# Patient Record
Sex: Female | Born: 1985 | Race: White | Hispanic: No | Marital: Single | Smoking: Never smoker
Health system: Southern US, Community
[De-identification: ages and names within clinical notes are randomized; demographics above are authoritative.]

## PROBLEM LIST (undated history)

## (undated) ENCOUNTER — Inpatient Hospital Stay (HOSPITAL_COMMUNITY): Payer: Self-pay

## (undated) DIAGNOSIS — E039 Hypothyroidism, unspecified: Secondary | ICD-10-CM

## (undated) DIAGNOSIS — R87629 Unspecified abnormal cytological findings in specimens from vagina: Secondary | ICD-10-CM

## (undated) DIAGNOSIS — D62 Acute posthemorrhagic anemia: Secondary | ICD-10-CM

## (undated) DIAGNOSIS — L739 Follicular disorder, unspecified: Secondary | ICD-10-CM

## (undated) HISTORY — PX: APPENDECTOMY: SHX54

## (undated) HISTORY — PX: LEEP: SHX91

---

## 2014-10-18 DIAGNOSIS — Z8742 Personal history of other diseases of the female genital tract: Secondary | ICD-10-CM | POA: Insufficient documentation

## 2015-04-26 ENCOUNTER — Encounter: Payer: Self-pay | Admitting: *Deleted

## 2015-04-26 ENCOUNTER — Emergency Department
Admission: EM | Admit: 2015-04-26 | Discharge: 2015-04-26 | Disposition: A | Discharge status: Home / Self Care | Payer: 59 | Source: Home / Self Care | Attending: Emergency Medicine | Admitting: Emergency Medicine

## 2015-04-26 DIAGNOSIS — S161XXA Strain of muscle, fascia and tendon at neck level, initial encounter: Secondary | ICD-10-CM | POA: Diagnosis not present

## 2015-04-26 DIAGNOSIS — M6248 Contracture of muscle, other site: Secondary | ICD-10-CM

## 2015-04-26 DIAGNOSIS — M62838 Other muscle spasm: Secondary | ICD-10-CM

## 2015-04-26 MED ORDER — CYCLOBENZAPRINE HCL 5 MG PO TABS: ORAL_TABLET | ORAL | Status: DC

## 2015-04-26 NOTE — ED Provider Notes (Signed)
CSN: [id number]     Arrival date & time 04/26/15  1025 History   First MD Initiated Contact with Patient 04/26/15 1046     Chief Complaint  Patient presents with  . Neck Pain    Patient is a 29 y.o. female presenting with musculoskeletal pain. The history is provided by the patient.  Muscle Pain This is a new (Right posterior neck pain, feels like the muscle spasm,) problem. Episode onset: About 9 days ago. The problem occurs constantly. The problem has not changed since onset.Pertinent negatives include no chest pain, no abdominal pain, no headaches and no shortness of breath. The symptoms are aggravated by bending and twisting. The symptoms are relieved by rest.   she recalls no injury. No radiation of neck pain. Denies focal weakness or numbness. The character of the pain is dull and crampy.  Denies back pain. No leg complaints or arm complaints History reviewed. No pertinent past medical history. Past Surgical History  Procedure Laterality Date  . Cesarean section     Family History  Problem Relation Age of Onset  . Hypertension Mother   . Hyperlipidemia Mother   . COPD Mother    History  Substance Use Topics  . Smoking status: Never Smoker   . Smokeless tobacco: Not on file  . Alcohol Use: No   OB History    No data available     Review of Systems  Respiratory: Negative for shortness of breath.   Cardiovascular: Negative for chest pain.  Gastrointestinal: Negative for abdominal pain.  Neurological: Negative for headaches.  Remainder of Review of Systems negative for acute change except as noted in the HPI.   Allergies  Review of patient's allergies indicates no known allergies.  Home Medications   Prior to Admission medications   Medication Sig Start Date End Date Taking? Authorizing Provider  MULTIPLE VITAMIN PO Take by mouth.   Yes Historical Provider, MD  cyclobenzaprine (FLEXERIL) 5 MG tablet Take 1 or 2 at bedtime as needed for muscle relaxant. Caution:  May cause drowsiness. 04/26/15   Lajean Manesavid Massey, MD   BP 110/74 mmHg  Pulse 76  Temp(Src) 98.4 F (36.9 C) (Oral)  Resp 16  Ht 5\' 1"  (1.549 m)  Wt 170 lb (77.111 kg)  BMI 32.14 kg/m2  SpO2 100% Physical Exam  Constitutional: She is oriented to person, place, and time. She appears well-developed and well-nourished. No distress.  HENT:  Head: Normocephalic and atraumatic.  Eyes: Conjunctivae and EOM are normal. Pupils are equal, round, and reactive to light. No scleral icterus.  Neck: Normal range of motion.  Cardiovascular: Normal rate.   Pulmonary/Chest: Effort normal.  Abdominal: She exhibits no distension.  Musculoskeletal: Normal range of motion.  Neurological: She is alert and oriented to person, place, and time.  Skin: Skin is warm.  Psychiatric: She has a normal mood and affect.  Nursing note and vitals reviewed.  right posterior cervical muscle spasm and tenderness . no spinal tenderness. No deformity or any neurologic deficit. There is some decreased range of motion of C-spine. Decreased range of motion to torsion and lateral bending to the left, and this motion exacerbates her pain.   ED Course  Procedures (including critical care time) Labs Review Labs Reviewed - No data to display  Imaging Review No results found.   MDM   1. Cervical muscle strain, initial encounter   2. Neck muscle spasm    right posterior cervical muscle spasm and tenderness . no spinal tenderness or  any neurologic deficit or symptoms. No evidence of discogenic cause. She has no point tenderness over C-spine, and after discussion, she declined any imaging at this time and I agree. Treatment options discussed, as well as risks, benefits, alternatives. Patient voiced understanding and agreement with the following plans: cyclobenzaprine (FLEXERIL) 5 MG tablet Take 1 or 2 at bedtime as needed for muscle relaxant. Caution: May cause drowsiness. 15 tablet   She declined any other prescription pain  medication. May use Tylenol or ibuprofen Other nonpharmacologic methods discussed. Gradual increased range of motion exercises. Heat or cold packs. Follow-up with your primary care doctor or orthopedist in 5-7 days if not improving, or sooner if symptoms become worse. Precautions discussed. Red flags discussed. Questions invited and answered. Patient voiced understanding and agreement.     Lajean Manes, MD 04/26/15 2013

## 2015-04-26 NOTE — ED Notes (Signed)
Pt c/o RT side neck/upper back pain x 1 wk. Denies injury.Tylenol helps some.

## 2016-01-30 ENCOUNTER — Emergency Department (HOSPITAL_COMMUNITY): Payer: 59 | Admitting: Anesthesiology

## 2016-01-30 ENCOUNTER — Encounter (HOSPITAL_BASED_OUTPATIENT_CLINIC_OR_DEPARTMENT_OTHER): Payer: Self-pay

## 2016-01-30 ENCOUNTER — Observation Stay (HOSPITAL_BASED_OUTPATIENT_CLINIC_OR_DEPARTMENT_OTHER)
Admission: EM | Admit: 2016-01-30 | Discharge: 2016-01-31 | Disposition: A | Discharge status: Home / Self Care | Payer: 59 | Attending: General Surgery | Admitting: General Surgery

## 2016-01-30 ENCOUNTER — Encounter (HOSPITAL_COMMUNITY)
Admission: EM | Disposition: A | Discharge status: Home / Self Care | Payer: Self-pay | Source: Home / Self Care | Attending: Emergency Medicine

## 2016-01-30 ENCOUNTER — Emergency Department (HOSPITAL_BASED_OUTPATIENT_CLINIC_OR_DEPARTMENT_OTHER): Payer: 59

## 2016-01-30 DIAGNOSIS — K353 Acute appendicitis with localized peritonitis: Principal | ICD-10-CM | POA: Insufficient documentation

## 2016-01-30 DIAGNOSIS — K3580 Unspecified acute appendicitis: Secondary | ICD-10-CM | POA: Diagnosis not present

## 2016-01-30 DIAGNOSIS — R1010 Upper abdominal pain, unspecified: Secondary | ICD-10-CM | POA: Diagnosis not present

## 2016-01-30 HISTORY — PX: LAPAROSCOPIC APPENDECTOMY: SHX408

## 2016-01-30 LAB — COMPREHENSIVE METABOLIC PANEL
ALK PHOS: 72 U/L (ref 38–126)
ALT: 18 U/L (ref 14–54)
AST: 18 U/L (ref 15–41)
Albumin: 4.4 g/dL (ref 3.5–5.0)
Anion gap: 9 (ref 5–15)
BUN: 13 mg/dL (ref 6–20)
CALCIUM: 9.1 mg/dL (ref 8.9–10.3)
CO2: 26 mmol/L (ref 22–32)
Chloride: 102 mmol/L (ref 101–111)
Creatinine, Ser: 1.06 mg/dL — ABNORMAL HIGH (ref 0.44–1.00)
GFR calc non Af Amer: >60 mL/min (ref >60)
Glucose, Bld: 112 mg/dL — ABNORMAL HIGH (ref 65–99)
Potassium: 4.2 mmol/L (ref 3.5–5.1)
Sodium: 137 mmol/L (ref 135–145)
Total Bilirubin: 1.5 mg/dL — ABNORMAL HIGH (ref 0.3–1.2)
Total Protein: 7.5 g/dL (ref 6.5–8.1)

## 2016-01-30 LAB — CBC WITH DIFFERENTIAL/PLATELET
Basophils Absolute: 0 10*3/uL (ref 0.0–0.1)
Basophils Relative: 0 %
EOS ABS: 0 10*3/uL (ref 0.0–0.7)
EOS PCT: 0 %
HCT: 39.8 % (ref 36.0–46.0)
Hemoglobin: 13.1 g/dL (ref 12.0–15.0)
LYMPHS ABS: 1.4 10*3/uL (ref 0.7–4.0)
Lymphocytes Relative: 7 %
MCH: 28.5 pg (ref 26.0–34.0)
MCHC: 32.9 g/dL (ref 30.0–36.0)
MCV: 86.7 fL (ref 78.0–100.0)
MONO ABS: 1 10*3/uL (ref 0.1–1.0)
Monocytes Relative: 5 %
Neutro Abs: 17.5 10*3/uL — ABNORMAL HIGH (ref 1.7–7.7)
Neutrophils Relative %: 88 %
PLATELETS: 253 10*3/uL (ref 150–400)
RBC: 4.59 MIL/uL (ref 3.87–5.11)
RDW: 13.1 % (ref 11.5–15.5)
WBC: 20 10*3/uL — AB (ref 4.0–10.5)

## 2016-01-30 LAB — URINALYSIS, ROUTINE W REFLEX MICROSCOPIC
BILIRUBIN URINE: NEGATIVE
Glucose, UA: NEGATIVE mg/dL
HGB URINE DIPSTICK: NEGATIVE
Ketones, ur: NEGATIVE mg/dL
Leukocytes, UA: NEGATIVE
Nitrite: NEGATIVE
Protein, ur: NEGATIVE mg/dL
SPECIFIC GRAVITY, URINE: 1.017 (ref 1.005–1.030)
pH: 7.5 (ref 5.0–8.0)

## 2016-01-30 LAB — LIPASE, BLOOD: Lipase: 25 U/L (ref 11–51)

## 2016-01-30 LAB — PREGNANCY, URINE: Preg Test, Ur: NEGATIVE

## 2016-01-30 SURGERY — APPENDECTOMY, LAPAROSCOPIC
Anesthesia: General | Site: Abdomen

## 2016-01-30 MED ORDER — ROCURONIUM BROMIDE 100 MG/10ML IV SOLN
INTRAVENOUS | Status: DC | PRN
  Administered 2016-01-30: 20 mg via INTRAVENOUS

## 2016-01-30 MED ORDER — GI COCKTAIL ~~LOC~~
30.0000 mL | Freq: Once | ORAL | Status: AC
  Administered 2016-01-30: 30 mL via ORAL
  Filled 2016-01-30: qty 30

## 2016-01-30 MED ORDER — SUCCINYLCHOLINE CHLORIDE 20 MG/ML IJ SOLN
INTRAMUSCULAR | Status: DC | PRN
  Administered 2016-01-30: 100 mg via INTRAVENOUS

## 2016-01-30 MED ORDER — 0.9 % SODIUM CHLORIDE (POUR BTL) OPTIME
TOPICAL | Status: DC | PRN
  Administered 2016-01-30: 1000 mL

## 2016-01-30 MED ORDER — IOHEXOL 300 MG/ML  SOLN
25.0000 mL | Freq: Once | INTRAMUSCULAR | Status: AC | PRN
  Administered 2016-01-30: 25 mL via ORAL

## 2016-01-30 MED ORDER — NEOSTIGMINE METHYLSULFATE 10 MG/10ML IV SOLN
INTRAVENOUS | Status: DC | PRN
  Administered 2016-01-30: 4 mg via INTRAVENOUS

## 2016-01-30 MED ORDER — LACTATED RINGERS IR SOLN
Status: DC | PRN
  Administered 2016-01-30: 1

## 2016-01-30 MED ORDER — AZITHROMYCIN 250 MG PO TABS
1000.0000 mg | ORAL_TABLET | Freq: Once | ORAL | Status: AC
  Administered 2016-01-30: 1000 mg via ORAL
  Filled 2016-01-30: qty 4

## 2016-01-30 MED ORDER — DEXTROSE 5 % IV SOLN: 1.0000 g | Freq: Once | INTRAVENOUS | Status: DC

## 2016-01-30 MED ORDER — HYDROMORPHONE HCL 1 MG/ML IJ SOLN
1.0000 mg | Freq: Once | INTRAMUSCULAR | Status: AC
  Administered 2016-01-30: 1 mg via INTRAVENOUS
  Filled 2016-01-30: qty 1

## 2016-01-30 MED ORDER — LACTATED RINGERS IV SOLN
INTRAVENOUS | Status: DC | PRN
  Administered 2016-01-30: 22:00:00 via INTRAVENOUS

## 2016-01-30 MED ORDER — BUPIVACAINE HCL (PF) 0.5 % IJ SOLN
INTRAMUSCULAR | Status: AC
  Filled 2016-01-30: qty 30

## 2016-01-30 MED ORDER — METRONIDAZOLE IN NACL 5-0.79 MG/ML-% IV SOLN
500.0000 mg | Freq: Once | INTRAVENOUS | Status: AC
  Administered 2016-01-30: 500 mg via INTRAVENOUS
  Filled 2016-01-30: qty 100

## 2016-01-30 MED ORDER — CEFTRIAXONE SODIUM 1 G IJ SOLR
1.0000 g | Freq: Once | INTRAMUSCULAR | Status: AC
  Administered 2016-01-30: 1 g via INTRAVENOUS
  Filled 2016-01-30: qty 10

## 2016-01-30 MED ORDER — DEXAMETHASONE SODIUM PHOSPHATE 10 MG/ML IJ SOLN
INTRAMUSCULAR | Status: DC | PRN
  Administered 2016-01-30: 10 mg via INTRAVENOUS

## 2016-01-30 MED ORDER — FENTANYL CITRATE (PF) 250 MCG/5ML IJ SOLN
INTRAMUSCULAR | Status: DC | PRN
  Administered 2016-01-30 (×5): 50 ug via INTRAVENOUS

## 2016-01-30 MED ORDER — HYDROMORPHONE HCL 1 MG/ML IJ SOLN
INTRAMUSCULAR | Status: AC
  Administered 2016-01-30: 0.5 mg via INTRAVENOUS
  Filled 2016-01-30: qty 1

## 2016-01-30 MED ORDER — LIDOCAINE HCL (CARDIAC) 20 MG/ML IV SOLN
INTRAVENOUS | Status: DC | PRN
  Administered 2016-01-30: 100 mg via INTRATRACHEAL

## 2016-01-30 MED ORDER — GLYCOPYRROLATE 0.2 MG/ML IJ SOLN
INTRAMUSCULAR | Status: DC | PRN
  Administered 2016-01-30: 0.6 mg via INTRAVENOUS

## 2016-01-30 MED ORDER — ONDANSETRON HCL 4 MG/2ML IJ SOLN
4.0000 mg | Freq: Once | INTRAMUSCULAR | Status: AC
  Administered 2016-01-30: 4 mg via INTRAVENOUS
  Filled 2016-01-30: qty 2

## 2016-01-30 MED ORDER — ONDANSETRON HCL 4 MG/2ML IJ SOLN: 4.0000 mg | Freq: Once | INTRAMUSCULAR | Status: DC | PRN

## 2016-01-30 MED ORDER — HYDROMORPHONE HCL 1 MG/ML IJ SOLN
0.2500 mg | INTRAMUSCULAR | Status: DC | PRN
  Administered 2016-01-30 (×2): 0.5 mg via INTRAVENOUS

## 2016-01-30 MED ORDER — OXYCODONE HCL 5 MG PO TABS: 5.0000 mg | ORAL_TABLET | Freq: Once | ORAL | Status: DC | PRN

## 2016-01-30 MED ORDER — OXYCODONE HCL 5 MG/5ML PO SOLN
5.0000 mg | Freq: Once | ORAL | Status: DC | PRN
  Filled 2016-01-30: qty 5

## 2016-01-30 MED ORDER — ONDANSETRON HCL 4 MG/2ML IJ SOLN
INTRAMUSCULAR | Status: DC | PRN
  Administered 2016-01-30: 4 mg via INTRAVENOUS

## 2016-01-30 MED ORDER — PROPOFOL 10 MG/ML IV BOLUS
INTRAVENOUS | Status: DC | PRN
  Administered 2016-01-30: 150 mg via INTRAVENOUS

## 2016-01-30 MED ORDER — BUPIVACAINE HCL (PF) 0.5 % IJ SOLN
INTRAMUSCULAR | Status: DC | PRN
  Administered 2016-01-30: 14 mL

## 2016-01-30 MED ORDER — MIDAZOLAM HCL 5 MG/5ML IJ SOLN
INTRAMUSCULAR | Status: DC | PRN
  Administered 2016-01-30: 2 mg via INTRAVENOUS

## 2016-01-30 MED ORDER — IOHEXOL 300 MG/ML  SOLN
100.0000 mL | Freq: Once | INTRAMUSCULAR | Status: AC | PRN
  Administered 2016-01-30: 100 mL via INTRAVENOUS

## 2016-01-30 SURGICAL SUPPLY — 44 items
APPLIER CLIP 5 13 M/L LIGAMAX5 (MISCELLANEOUS)
APPLIER CLIP ROT 10 11.4 M/L (STAPLE)
BENZOIN TINCTURE PRP APPL 2/3 (GAUZE/BANDAGES/DRESSINGS) ×3 IMPLANT
CHLORAPREP W/TINT 26ML (MISCELLANEOUS) ×3 IMPLANT
CLIP APPLIE 5 13 M/L LIGAMAX5 (MISCELLANEOUS) IMPLANT
CLIP APPLIE ROT 10 11.4 M/L (STAPLE) IMPLANT
CLOSURE STERI-STRIP 1/4X4 (GAUZE/BANDAGES/DRESSINGS) ×3 IMPLANT
CLOSURE WOUND 1/2 X4 (GAUZE/BANDAGES/DRESSINGS) ×1
COVER SURGICAL LIGHT HANDLE (MISCELLANEOUS) ×3 IMPLANT
CUTTER FLEX LINEAR 45M (STAPLE) ×3 IMPLANT
DECANTER SPIKE VIAL GLASS SM (MISCELLANEOUS) ×3 IMPLANT
DRAIN CHANNEL 19F RND (DRAIN) IMPLANT
DRAPE LAPAROSCOPIC ABDOMINAL (DRAPES) ×3 IMPLANT
DRSG TEGADERM 2-3/8X2-3/4 SM (GAUZE/BANDAGES/DRESSINGS) ×3 IMPLANT
ELECT REM PT RETURN 9FT ADLT (ELECTROSURGICAL) ×3
ELECTRODE REM PT RTRN 9FT ADLT (ELECTROSURGICAL) ×1 IMPLANT
ENDOLOOP SUT PDS II  0 18 (SUTURE)
ENDOLOOP SUT PDS II 0 18 (SUTURE) IMPLANT
EVACUATOR SILICONE 100CC (DRAIN) IMPLANT
GAUZE SPONGE 2X2 8PLY STRL LF (GAUZE/BANDAGES/DRESSINGS) ×1 IMPLANT
GLOVE ECLIPSE 8.0 STRL XLNG CF (GLOVE) ×3 IMPLANT
GLOVE INDICATOR 8.0 STRL GRN (GLOVE) ×3 IMPLANT
GOWN STRL REUS W/TWL XL LVL3 (GOWN DISPOSABLE) ×6 IMPLANT
KIT BASIN OR (CUSTOM PROCEDURE TRAY) ×3 IMPLANT
POUCH SPECIMEN RETRIEVAL 10MM (ENDOMECHANICALS) ×3 IMPLANT
RELOAD 45 VASCULAR/THIN (ENDOMECHANICALS) IMPLANT
RELOAD STAPLE TA45 3.5 REG BLU (ENDOMECHANICALS) ×3 IMPLANT
SCISSORS LAP 5X35 DISP (ENDOMECHANICALS) ×3 IMPLANT
SET IRRIG TUBING LAPAROSCOPIC (IRRIGATION / IRRIGATOR) ×3 IMPLANT
SHEARS HARMONIC ACE PLUS 36CM (ENDOMECHANICALS) ×3 IMPLANT
SLEEVE XCEL OPT CAN 5 100 (ENDOMECHANICALS) ×3 IMPLANT
SOLUTION ANTI FOG 6CC (MISCELLANEOUS) ×3 IMPLANT
SPONGE GAUZE 2X2 STER 10/PKG (GAUZE/BANDAGES/DRESSINGS) ×2
STRIP CLOSURE SKIN 1/2X4 (GAUZE/BANDAGES/DRESSINGS) ×2 IMPLANT
SUT ETHILON 3 0 PS 1 (SUTURE) IMPLANT
SUT MNCRL AB 4-0 PS2 18 (SUTURE) ×3 IMPLANT
TOWEL OR 17X26 10 PK STRL BLUE (TOWEL DISPOSABLE) ×3 IMPLANT
TOWEL OR NON WOVEN STRL DISP B (DISPOSABLE) ×3 IMPLANT
TRAY FOLEY W/METER SILVER 14FR (SET/KITS/TRAYS/PACK) ×3 IMPLANT
TRAY FOLEY W/METER SILVER 16FR (SET/KITS/TRAYS/PACK) ×3 IMPLANT
TRAY LAPAROSCOPIC (CUSTOM PROCEDURE TRAY) ×3 IMPLANT
TROCAR BLADELESS OPT 5 100 (ENDOMECHANICALS) ×3 IMPLANT
TROCAR XCEL BLUNT TIP 100MML (ENDOMECHANICALS) ×3 IMPLANT
TUBING INSUFFLATION 10FT LAP (TUBING) ×3 IMPLANT

## 2016-01-30 NOTE — ED Notes (Signed)
Pa  at bedside. 

## 2016-01-30 NOTE — ED Notes (Signed)
Consent obtained

## 2016-01-30 NOTE — ED Notes (Signed)
amb to BR w/o difficulty 

## 2016-01-30 NOTE — ED Notes (Signed)
Patient transported to CT 

## 2016-01-30 NOTE — H&P (Signed)
Hannah Floyd is an 30 y.o. female.   Chief Complaint:   Abdominal pain HPI:   She had the onset of upper abdominal pain today that radiated to her lower abdomen and intensified. She had some nausea but no fever or chills. She went to [facility] for evaluation.  She was noted to have a leukocytosis while there. CT scan demonstrated a dilated appendix and some early inflammatory changes consistent with acute appendicitis by CT criteria.Urinalysis was negative. She subsequently was sent here for definitive treatment for her acute appendicitis. She works as a Marine scientist at [employer].  History reviewed. No pertinent past medical history.  Past Surgical History  Procedure Laterality Date  . Cesarean section      Family History  Problem Relation Age of Onset  . Hypertension Mother   . Hyperlipidemia Mother   . COPD Mother    Social History:  reports that she has never smoked. She does not have any smokeless tobacco history on file. She reports that she does not drink alcohol or use illicit drugs.  Allergies: No Known Allergies   (Not in a hospital admission)  Results for orders placed or performed during the hospital encounter of 01/30/16 (from the past 48 hour(s))  Urinalysis, Routine w reflex microscopic (not at [facility])     Status: None   Collection Time: 01/30/16  3:30 PM  Result Value Ref Range   Color, Urine YELLOW YELLOW   APPearance CLEAR CLEAR   Specific Gravity, Urine 1.017 1.005 - 1.030   pH 7.5 5.0 - 8.0   Glucose, UA NEGATIVE NEGATIVE mg/dL   Hgb urine dipstick NEGATIVE NEGATIVE   Bilirubin Urine NEGATIVE NEGATIVE   Ketones, ur NEGATIVE NEGATIVE mg/dL   Protein, ur NEGATIVE NEGATIVE mg/dL   Nitrite NEGATIVE NEGATIVE   Leukocytes, UA NEGATIVE NEGATIVE    Comment: MICROSCOPIC NOT DONE ON URINES WITH NEGATIVE PROTEIN, BLOOD, LEUKOCYTES, NITRITE, OR GLUCOSE <1000 mg/dL.  Pregnancy, urine     Status: None   Collection Time: 01/30/16  3:30 PM  Result Value  Ref Range   Preg Test, Ur NEGATIVE NEGATIVE    Comment:        THE SENSITIVITY OF THIS METHODOLOGY IS >20 mIU/mL.   Comprehensive metabolic panel     Status: Abnormal   Collection Time: 01/30/16  5:35 PM  Result Value Ref Range   Sodium 137 135 - 145 mmol/L   Potassium 4.2 3.5 - 5.1 mmol/L   Chloride 102 101 - 111 mmol/L   CO2 26 22 - 32 mmol/L   Glucose, Bld 112 (H) 65 - 99 mg/dL   BUN 13 6 - 20 mg/dL   Creatinine, Ser 1.06 (H) 0.44 - 1.00 mg/dL   Calcium 9.1 8.9 - 10.3 mg/dL   Total Protein 7.5 6.5 - 8.1 g/dL   Albumin 4.4 3.5 - 5.0 g/dL   AST 18 15 - 41 U/L   ALT 18 14 - 54 U/L   Alkaline Phosphatase 72 38 - 126 U/L   Total Bilirubin 1.5 (H) 0.3 - 1.2 mg/dL   GFR calc non Af Amer >60 >60 mL/min   GFR calc Af Amer >60 >60 mL/min    Comment: (NOTE) The eGFR has been calculated using the CKD EPI equation. This calculation has not been validated in all clinical situations. eGFR's persistently <60 mL/min signify possible Chronic Kidney Disease.    Anion gap 9 5 - 15  Lipase, blood     Status: None  Collection Time: 01/30/16  5:35 PM  Result Value Ref Range   Lipase 25 11 - 51 U/L  CBC with Differential     Status: Abnormal   Collection Time: 01/30/16  5:35 PM  Result Value Ref Range   WBC 20.0 (H) 4.0 - 10.5 K/uL   RBC 4.59 3.87 - 5.11 MIL/uL   Hemoglobin 13.1 12.0 - 15.0 g/dL   HCT 39.8 36.0 - 46.0 %   MCV 86.7 78.0 - 100.0 fL   MCH 28.5 26.0 - 34.0 pg   MCHC 32.9 30.0 - 36.0 g/dL   RDW 13.1 11.5 - 15.5 %   Platelets 253 150 - 400 K/uL   Neutrophils Relative % 88 %   Neutro Abs 17.5 (H) 1.7 - 7.7 K/uL   Lymphocytes Relative 7 %   Lymphs Abs 1.4 0.7 - 4.0 K/uL   Monocytes Relative 5 %   Monocytes Absolute 1.0 0.1 - 1.0 K/uL   Eosinophils Relative 0 %   Eosinophils Absolute 0.0 0.0 - 0.7 K/uL   Basophils Relative 0 %   Basophils Absolute 0.0 0.0 - 0.1 K/uL   Ct Abdomen Pelvis W Contrast  01/30/2016  CLINICAL DATA:  Complains of upper abdominal pain today,  nausea. EXAM: CT ABDOMEN AND PELVIS WITH CONTRAST TECHNIQUE: Multidetector CT imaging of the abdomen and pelvis was performed using the standard protocol following bolus administration of intravenous contrast. CONTRAST:  73m OMNIPAQUE IOHEXOL 300 MG/ML SOLN, 1040mOMNIPAQUE IOHEXOL 300 MG/ML SOLN COMPARISON:  None. FINDINGS: BODY WALL: Unremarkable LOWER CHEST: Unremarkable. ABDOMEN/PELVIS: Liver: No focal abnormality. Biliary: No evidence of biliary obstruction or stone. Pancreas: Unremarkable. Spleen: Unremarkable. Adrenals: Unremarkable. Kidneys and ureters: No hydronephrosis or stone. Bladder: Unremarkable. Reproductive: Unremarkable. RIGHT corpus luteum cyst. IUD appropriately positioned. Bowel: No obstruction. Enlarged fluid-filled appendix, up to 8 mm diameter, mild periappendiceal stranding. Retroperitoneum: No mass or adenopathy. Peritoneum: No free air. Small amount of free fluid considered to be physiologic from corpus luteum cyst. Vascular: No acute abnormality. OSSEOUS: No acute abnormalities. IMPRESSION: Early acute appendicitis.  No perforation. RIGHT corpus luteum cyst.  Small amount of free screw cul-de-sac. These results were called by telephone at the time of interpretation on 01/30/2016 at 7:18 pm to Dr. BEAudie Pintowho verbally acknowledged these results. Electronically Signed   By: JoStaci Righter.D.   On: 01/30/2016 19:22    Review of Systems  Constitutional: Negative for fever and chills.  Respiratory: Negative for shortness of breath.   Cardiovascular: Negative for chest pain.  Gastrointestinal: Positive for nausea and abdominal pain. Negative for diarrhea and constipation.  Genitourinary: Negative for dysuria and hematuria.  Neurological: Negative for focal weakness and seizures.  Endo/Heme/Allergies:       No bleeding disorders.    Blood pressure 123/86, pulse 113, temperature 98 F (36.7 C), temperature source Oral, resp. rate 18, height 5' 1"  (1.549 m), weight 77.111 kg (170  lb), SpO2 98 %. Physical Exam  Constitutional: She appears well-developed and well-nourished.  She is slightly anxious.  HENT:  Head: Normocephalic and atraumatic.  Eyes: No scleral icterus.  Cardiovascular:  Increased rate.  Respiratory: Effort normal and breath sounds normal.  GI: Soft. She exhibits no mass. There is tenderness (Right lower quadrantand suprapubic regions.). There is no guarding.  Musculoskeletal: She exhibits no edema.  Neurological: She is alert.  Skin: Skin is warm and dry.  Psychiatric: She has a normal mood and affect. Her behavior is normal.     Assessment/Plan Acute appendicitis.  Plan: Intravenous  antibiotics. Laparoscopic possible open appendectomy. I have discussed the procedure, aftercare, and risks of appendectomy. The risks include but are not limited to bleeding, infection, wound problems, anesthesia, injury to intra-abdominal organs, possibility of postoperative ileus. She seems to understand and agrees with the plan.  Odis Hollingshead, MD 01/30/2016, 9:44 PM

## 2016-01-30 NOTE — Transfer of Care (Signed)
Immediate Anesthesia Transfer of Care Note  Patient: Hannah Floyd  Procedure(s) Performed: Procedure(s): APPENDECTOMY LAPAROSCOPIC (N/A)  Patient Location: PACU  Anesthesia Type:General  Level of Consciousness:  sedated, patient cooperative and responds to stimulation  Airway & Oxygen Therapy:Patient Spontanous Breathing and Patient connected to face mask oxgen  Post-op Assessment:  Report given to PACU RN and Post -op Vital signs reviewed and stable  Post vital signs:  Reviewed and stable  Last Vitals:  Filed Vitals:   01/30/16 1749 01/30/16 2014  BP: 113/72 123/86  Pulse: 98 113  Temp:    Resp: 18     Complications: No apparent anesthesia complications

## 2016-01-30 NOTE — Discharge Instructions (Addendum)
LAPAROSCOPIC SURGERY: POST OP INSTRUCTIONS ° °1. DIET: Follow a light bland diet the first 24 hours after arrival home, such as soup, liquids, crackers, etc.  Be sure to include lots of fluids daily.  Avoid fast food or heavy meals as your are more likely to get nauseated.  Eat a low fat the next few days after surgery.   °2. Take your usually prescribed home medications unless otherwise directed. °3. PAIN CONTROL: °a. Pain is best controlled by a usual combination of three different methods TOGETHER: °i. Ice/Heat °ii. Over the counter pain medication °iii. Prescription pain medication °b. Most patients will experience some swelling and bruising around the incisions.  Ice packs or heating pads (30-60 minutes up to 6 times a day) will help. Use ice for the first few days to help decrease swelling and bruising, then switch to heat to help relax tight/sore spots and speed recovery.  Some people prefer to use ice alone, heat alone, alternating between ice & heat.  Experiment to what works for you.  Swelling and bruising can take several weeks to resolve.   °c. It is helpful to take an over-the-counter pain medication regularly for the first few weeks.  Choose one of the following that works best for you: °i. Naproxen (Aleve, etc)  Two 220mg tabs twice a day °ii. Ibuprofen (Advil, etc) Three 200mg tabs four times a day (every meal & bedtime) °iii. Acetaminophen (Tylenol, etc) 500-650mg four times a day (every meal & bedtime) °d. A  prescription for pain medication (such as oxycodone, hydrocodone, etc) should be given to you upon discharge.  Take your pain medication as prescribed.  °i. If you are having problems/concerns with the prescription medicine (does not control pain, nausea, vomiting, rash, itching, etc), please call us [phone] to see if we need to switch you to a different pain medicine that will work better for you and/or control your side effect better. °ii. If you need a refill on your pain medication,  please contact your pharmacy.  They will contact our office to request authorization. Prescriptions will not be filled after 5 pm or on week-ends. °4. Avoid getting constipated.  Between the surgery and the pain medications, it is common to experience some constipation.  Increasing fluid intake and taking a fiber supplement (such as Metamucil, Citrucel, FiberCon, MiraLax, etc) 1-2 times a day regularly will usually help prevent this problem from occurring.  A mild laxative (prune juice, Milk of Magnesia, MiraLax, etc) should be taken according to package directions if there are no bowel movements after 48 hours.   °5. Watch out for diarrhea.  If you have many loose bowel movements, simplify your diet to bland foods & liquids for a few days.  Stop any stool softeners and decrease your fiber supplement.  Switching to mild anti-diarrheal medications (Kayopectate, Pepto Bismol) can help.  If this worsens or does not improve, please call us. °6. Wash / shower every day.  You may shower over the dressings as they are waterproof.  Continue to shower over incision(s) after the dressing is off. °7. Remove your waterproof bandages 3 days after surgery.  You may leave the incision open to air.  You may replace a dressing/Band-Aid to cover the incision for comfort if you wish.  °8. ACTIVITIES as tolerated:   °a. You may resume regular (light) daily activities beginning the next day--such as daily self-care, walking, climbing stairs--gradually increasing light activities as tolerated.  No heavy lifting (over 10 pounds), straining, or   intense activities for 2 weeks. °b. DO NOT PUSH THROUGH PAIN.  Let pain be your guide: If it hurts to do something, don't do it.  Pain is your body warning you to avoid that activity for another week until the pain goes down. °c. You may drive when you are no longer taking prescription pain medication, you can comfortably wear a seatbelt, and you can safely maneuver your car and apply  brakes. °d. You may have sexual intercourse when it is comfortable.  °9. FOLLOW UP in our office °a. Please call [facility] at [phone] to set up an appointment to see your surgeon in the office for a follow-up appointment approximately 2-3 weeks after your surgery. °b. Make sure that you call for this appointment the day you arrive home to insure a convenient appointment time. °10. IF YOU HAVE DISABILITY OR FAMILY LEAVE FORMS, BRING THEM TO THE OFFICE FOR PROCESSING.  DO NOT GIVE THEM TO YOUR DOCTOR. ° °11.  Return to work/school:  Desk work/light activities in 5-7 days, full duty/activities in 2 weeks if pain-free. ° ° °WHEN TO CALL US [phone]: °1. Poor pain control °2. Reactions / problems with new medications (rash/itching, nausea, etc)  °3. Fever over 101.5 F (38.5 C) °4. Inability to urinate °5. Nausea and/or vomiting °6. Worsening swelling or bruising °7. Continued bleeding from incision. °8. Increased pain, redness, or drainage from the incision ° ° The clinic staff is available to answer your questions during regular business hours (8:30am-5pm).  Please don’t hesitate to call and ask to speak to one of our nurses for clinical concerns.  ° If you have a medical emergency, go to the nearest emergency room or call 911. ° A surgeon from [facility] is always on call at the hospitals ° ° °[facility] °[address], [address], [place], [place]  [address] ? °MAIN: [phone] ? TOLL FREE: [phone] ?  °FAX [phone] °[email or web address] ° °

## 2016-01-30 NOTE — ED Notes (Signed)
abd pain, nausea, denies vaginal d/c or urinary s/s

## 2016-01-30 NOTE — ED Provider Notes (Signed)
CSN: [id number]     Arrival date & time 01/30/16  1512 History   First MD Initiated Contact with Patient 01/30/16 1625     Chief Complaint  Patient presents with  . Abdominal Pain     (Consider location/radiation/quality/duration/timing/severity/associated sxs/prior Treatment) HPI Hannah Floyd is a 30 y.o. female because of her evaluation of abdominal discomfort. Patient reports feeling very full last night. She reports onset of epigastric discomfort today it has been constant throughout the day. She reports this discomfort is worse after eating. He has not tried anything to improve symptoms. She reports some mild nausea. No radiation of pain. Reports normal bowel movements and is still passing gas. No fevers, chills, vomiting, urinary symptoms, vaginal bleeding or discharge. She reports last menstrual period was last month, but she uses Mirena and has irregular periods. No other modifying factors. No history of hypertension, hyperlipidemia, diabetes. No family cardiac history  History reviewed. No pertinent past medical history. Past Surgical History  Procedure Laterality Date  . Cesarean section     Family History  Problem Relation Age of Onset  . Hypertension Mother   . Hyperlipidemia Mother   . COPD Mother    Social History  Substance Use Topics  . Smoking status: Never Smoker   . Smokeless tobacco: None  . Alcohol Use: No   OB History    No data available     Review of Systems A 10 point review of systems was completed and was negative except for pertinent positives and negatives as mentioned in the history of present illness     Allergies  Review of patient's allergies indicates no known allergies.  Home Medications   Prior to Admission medications   Not on File   BP 113/72 mmHg  Pulse 98  Temp(Src) 98 F (36.7 C) (Oral)  Resp 18  Ht  (1.549 m)  Wt 77.111 kg  BMI 32.14 kg/m2  SpO2 99% Physical Exam  Constitutional: She is oriented to person,  place, and time. She appears well-developed and well-nourished.  HENT:  Head: Normocephalic and atraumatic.  Mouth/Throat: Oropharynx is clear and moist.  Eyes: Conjunctivae are normal. Pupils are equal, round, and reactive to light. Right eye exhibits no discharge. Left eye exhibits no discharge. No scleral icterus.  Neck: Neck supple.  Cardiovascular: Normal rate, regular rhythm and normal heart sounds.   Pulmonary/Chest: Effort normal and breath sounds normal. No respiratory distress. She has no wheezes. She has no rales.  Abdominal: Soft. There is no tenderness.  Tenderness diffusely throughout epigastrium and periumbilical region. Abdomen is otherwise soft, nondistended. No rebound or guarding. No other abnormalities.  Musculoskeletal: She exhibits no tenderness.  Neurological: She is alert and oriented to person, place, and time.  Cranial Nerves II-XII grossly intact  Skin: Skin is warm and dry. No rash noted.  Psychiatric: She has a normal mood and affect.  Nursing note and vitals reviewed.   ED Course  Procedures (including critical care time) Labs Review Labs Reviewed  COMPREHENSIVE METABOLIC PANEL - Abnormal; Notable for the following:    Glucose, Bld 112 (*)    Creatinine, Ser 1.06 (*)    Total Bilirubin 1.5 (*)    All other components within normal limits  CBC WITH DIFFERENTIAL/PLATELET - Abnormal; Notable for the following:    WBC 20.0 (*)    Neutro Abs 17.5 (*)    All other components within normal limits  URINE CULTURE  URINALYSIS, ROUTINE W REFLEX MICROSCOPIC (NOT AT [facility])  PREGNANCY, URINE  LIPASE, BLOOD    Imaging Review Ct Abdomen Pelvis W Contrast  01/30/2016  CLINICAL DATA:  Complains of upper abdominal pain today, nausea. EXAM: CT ABDOMEN AND PELVIS WITH CONTRAST TECHNIQUE: Multidetector CT imaging of the abdomen and pelvis was performed using the standard protocol following bolus administration of intravenous contrast. CONTRAST:  25mL OMNIPAQUE IOHEXOL 300  MG/ML SOLN, OMNIPAQUE IOHEXOL 300 MG/ML SOLN COMPARISON:  None. FINDINGS: BODY WALL: Unremarkable LOWER CHEST: Unremarkable. ABDOMEN/PELVIS: Liver: No focal abnormality. Biliary: No evidence of biliary obstruction or stone. Pancreas: Unremarkable. Spleen: Unremarkable. Adrenals: Unremarkable. Kidneys and ureters: No hydronephrosis or stone. Bladder: Unremarkable. Reproductive: Unremarkable. RIGHT corpus luteum cyst. IUD appropriately positioned. Bowel: No obstruction. Enlarged fluid-filled appendix, up to 8 mm diameter, mild periappendiceal stranding. Retroperitoneum: No mass or adenopathy. Peritoneum: No free air. Small amount of free fluid considered to be physiologic from corpus luteum cyst. Vascular: No acute abnormality. OSSEOUS: No acute abnormalities. IMPRESSION: Early acute appendicitis.  No perforation. RIGHT corpus luteum cyst.  Small amount of free screw cul-de-sac. These results were called by telephone at the time of interpretation on 01/30/2016 at 7:18 pm to Dr. Radford Pax, who verbally acknowledged these results. Electronically Signed   By: Elsie Stain M.D.   On: 01/30/2016 19:22   I have personally reviewed and evaluated these images and lab results as part of my medical decision-making.   EKG Interpretation None     Filed Vitals:   01/30/16 1530 01/30/16 1749  BP: 121/90 113/72  Pulse: 102 98  Temp: 98 F (36.7 C)   TempSrc: Oral   Resp: 18 18  Height:  (1.549 m)   Weight: 77.111 kg   SpO2: 100% 99%    MDM  Hannah Floyd is a 30 y.o. female who is otherwise healthy, comes in for evaluation of abdominal pain. She is hemodynamically stable and afebrile. She is diffusely tender in her epigastric and periumbilical region. Last ate at 12:00 PM. Basic labs show a leukocytosis of 20. Obtain CT abdomen to rule out appendicitis.. CT abdomen shows evidence of developing appendicitis. Discussed with general surgery, Dr. Abbey Chatters, recommends 1 g Rocephin, 500 mg metronidazole  and transfer to [facility] for subsequent surgery. Patient reports she feels well, declines any further pain medicine. She is stable for discharge at this time. Final diagnoses:  Acute appendicitis, unspecified acute appendicitis type        Joycie Peek, PA-C 01/30/16 2034  Nelva Nay, MD 01/30/16 2308

## 2016-01-30 NOTE — Op Note (Signed)
Appendectomy, Lap, Procedure Note  Pre-operative Diagnosis:  Acute appendicitis  Post-operative Diagnosis: Same  Procedure:  Laparoscopic appendectomy  Surgeon:  Avel Peace, M.D.  Anesthesia:  General   Indications:  This is a 30 year old female who presented to the ED with worsening centralized abdominal pain and a leukocytosis.  CT scan was consistent with acute appendicitis.  She is now brought to the OR for appendectomy.   Procedure Details   She was brought to the operating room, placed in the supine position and general anesthesia was induced, along with placement of orogastric tube, SCDs, and a Foley catheter. A timeout was performed. The abdomen was prepped and draped in a sterile fashion. A small infraumbilical incision was made through the skin, subcutaneous tissue, fascia, and peritoneum entering the peritoneal cavity under direct vision.  A pursestring suture was passed around the fascia with a 0 Vicryl.  The Hasson was introduced into the peritoneal cavity and the tails of the suture were used to hold the Hasson in place.   The pneumoperitoneum was then established to steady pressure of 15 mmHg.   The laparoscope was introduced and there was no evidence of bleeding or underlying organ injury. Additional 5 mm cannulas then placed in the left lower quadrant of the abdomen and the right upper quadrant region under direct visualization. A careful evaluation of the entire abdomen was carried out. The patient was placed in Trendelenburg and left lateral decubitus position. The small intestines were retracted in the cephalad and left lateral direction away from the pelvis and right lower quadrant. The patient was found to have an enlarged and inflamed appendix that was extending into the pelvis. There was no evidence of perforation.  The appendix was carefully mobilized. The mesoappendix was was divided with the harmonic scalpel.   The appendix was amputated off the cecum, with a  small cuff of cecum, using an endo-GIA stapler.  The appendix was placed in a retrieval bag and removed through the subumbilical port incision.    There was no evidence of bleeding, leakage, or complication after division of the appendix. Copious irrigation was  performed and irrigant fluid suctioned from the abdomen as much as possible.  The umbilical trocar was removed and the  port site fascia was closed via the purse string suture under laparoscopic vision. There was no residual palpable fascial defect.  The remaining trocars were removed and all  trocar site skin wounds were closed with 4-0 Monocryl.  Steri strips and sterile dressings were applied.  Instrument, sponge, and needle counts were correct at the conclusion of the case.   Findings: The appendix was found to be inflamed. There were not signs of necrosis.  There was not perforation. There was not abscess formation.  Estimated Blood Loss:  100 ml         Drains: none          Specimens: appendix         Complications:  None; patient tolerated the procedure well.         Disposition: PACU - hemodynamically stable.         Condition: stable

## 2016-01-30 NOTE — Anesthesia Procedure Notes (Signed)
Procedure Name: Intubation Date/Time: 01/30/2016 10:02 PM Performed by: Delphia Grates Pre-anesthesia Checklist: Emergency Drugs available, Patient identified, Suction available and Patient being monitored Patient Re-evaluated:Patient Re-evaluated prior to inductionOxygen Delivery Method: Circle system utilized Preoxygenation: Pre-oxygenation with 100% oxygen Intubation Type: IV induction, Rapid sequence and Cricoid Pressure applied Laryngoscope Size: Mac and 4 Grade View: Grade I Tube type: Oral Tube size: 7.5 mm Number of attempts: 1 Airway Equipment and Method: Stylet Placement Confirmation: ETT inserted through vocal cords under direct vision,  breath sounds checked- equal and bilateral and positive ETCO2 Secured at: 21 cm Tube secured with: Tape Dental Injury: Teeth and Oropharynx as per pre-operative assessment

## 2016-01-30 NOTE — ED Notes (Signed)
[facility] here for transport to [facility] ED

## 2016-01-30 NOTE — ED Notes (Signed)
MD at bedside. 

## 2016-01-30 NOTE — Anesthesia Preprocedure Evaluation (Signed)
Anesthesia Evaluation  Patient identified by MRN, date of birth, ID band Patient awake    Reviewed: Allergy & Precautions, NPO status , Patient's Chart, lab work & pertinent test results  Airway Mallampati: II  TM Distance: >3 FB Neck ROM: Full    Dental  (+) Teeth Intact, Dental Advisory Given   Pulmonary    breath sounds clear to auscultation       Cardiovascular  Rhythm:Regular Rate:Normal     Neuro/Psych    GI/Hepatic   Endo/Other    Renal/GU      Musculoskeletal   Abdominal   Peds  Hematology   Anesthesia Other Findings   Reproductive/Obstetrics                             Anesthesia Physical Anesthesia Plan  ASA: II and emergent  Anesthesia Plan: General   Post-op Pain Management:    Induction: Intravenous, Cricoid pressure planned and Rapid sequence  Airway Management Planned: Oral ETT  Additional Equipment:   Intra-op Plan:   Post-operative Plan: Extubation in OR  Informed Consent: I have reviewed the patients History and Physical, chart, labs and discussed the procedure including the risks, benefits and alternatives for the proposed anesthesia with the patient or authorized representative who has indicated his/her understanding and acceptance.   Dental advisory given  Plan Discussed with: CRNA and Anesthesiologist  Anesthesia Plan Comments:         Anesthesia Quick Evaluation

## 2016-01-30 NOTE — ED Notes (Signed)
Bed: WA17 Expected date:  Expected time:  Means of arrival:  Comments: [facility] from [facility] [place] [facility]/appy WBC 21

## 2016-01-31 ENCOUNTER — Encounter (HOSPITAL_COMMUNITY): Payer: Self-pay | Admitting: General Surgery

## 2016-01-31 ENCOUNTER — Encounter: Payer: Self-pay | Admitting: General Surgery

## 2016-01-31 DIAGNOSIS — K353 Acute appendicitis with localized peritonitis: Secondary | ICD-10-CM | POA: Diagnosis not present

## 2016-01-31 LAB — URINE CULTURE
Culture: NO GROWTH
Special Requests: NORMAL

## 2016-01-31 MED ORDER — DEXTROSE 5 % IV SOLN
2.0000 g | INTRAVENOUS | Status: AC
  Administered 2016-01-31: 2 g via INTRAVENOUS
  Filled 2016-01-31: qty 2

## 2016-01-31 MED ORDER — METRONIDAZOLE IN NACL 5-0.79 MG/ML-% IV SOLN
500.0000 mg | Freq: Three times a day (TID) | INTRAVENOUS | Status: DC
  Administered 2016-01-31 (×2): 500 mg via INTRAVENOUS
  Filled 2016-01-31 (×3): qty 100

## 2016-01-31 MED ORDER — HYDROCODONE-ACETAMINOPHEN 5-325 MG PO TABS: 1.0000 | ORAL_TABLET | ORAL | Status: DC | PRN

## 2016-01-31 MED ORDER — HYDROCODONE-ACETAMINOPHEN 5-325 MG PO TABS
1.0000 | ORAL_TABLET | ORAL | Status: DC | PRN
  Administered 2016-01-31: 1 via ORAL
  Filled 2016-01-31: qty 1

## 2016-01-31 MED ORDER — ONDANSETRON HCL 4 MG/2ML IJ SOLN
4.0000 mg | INTRAMUSCULAR | Status: DC | PRN
  Administered 2016-01-31: 4 mg via INTRAVENOUS
  Filled 2016-01-31: qty 2

## 2016-01-31 MED ORDER — ONDANSETRON 4 MG PO TBDP: 4.0000 mg | ORAL_TABLET | Freq: Four times a day (QID) | ORAL | Status: DC | PRN

## 2016-01-31 MED ORDER — MORPHINE SULFATE (PF) 2 MG/ML IV SOLN: 2.0000 mg | INTRAVENOUS | Status: DC | PRN

## 2016-01-31 MED ORDER — KCL IN DEXTROSE-NACL 20-5-0.9 MEQ/L-%-% IV SOLN
INTRAVENOUS | Status: DC
  Administered 2016-01-31: 01:00:00 via INTRAVENOUS
  Filled 2016-01-31 (×3): qty 1000

## 2016-01-31 NOTE — Care Management Note (Signed)
Case Management Note  Patient Details  Name: Hannah Floyd MRN: [id number] Date of Birth: Mar 10, 1986  Subjective/Objective:                  Acute appendicitis Action/Plan: Discharge planning Expected Discharge Date:  01/31/16               Expected Discharge Plan:  Home/Self Care  In-House Referral:     Discharge planning Services     Post Acute Care Choice:    Choice offered to:  Patient  DME Arranged:    DME Agency:     HH Arranged:    [place] Agency:     Status of Service:  Completed, signed off  Medicare Important Message Given:    Date Medicare IM Given:    Medicare IM give by:    Date Additional Medicare IM Given:    Additional Medicare Important Message give by:     If discussed at [place] of Stay Meetings, dates discussed:    Additional Comments: Cm met with pt to confirm NO PCP.  Pt states she does NOT have a PCP.  CM gave pt [facility]  As a resource to secure a PCP.  No other CM needs were communicated. Dellie Catholic, RN 01/31/2016, 11:42 AM

## 2016-01-31 NOTE — Progress Notes (Signed)
1 Day Post-Op  Subjective: She is doing well right now. She says she vomited earlier, but just ate breakfast with eggs so we will see how she does.  Aim for discharge later today.  Objective: Vital signs in last 24 hours: Temp:  [97.1 F (36.2 C)-98.6 F (37 C)] 98 F (36.7 C) (02/07 0634) Pulse Rate:  [92-123] 92 (02/07 0634) Resp:  [16-23] 20 (02/07 0634) BP: (96-130)/(50-90) 116/70 mmHg (02/07 0634) SpO2:  [94 %-100 %] 100 % (02/07 0634) Weight:  [76.3 kg (168 lb 3.4 oz)-77.111 kg (170 lb)] 76.3 kg (168 lb 3.4 oz) (02/07 0002) Last BM Date: 01/30/16 PO 240 Urine 600 Regular diet Afebrile, VSS No labs this AM Intake/Output from previous day: 02/06 0701 - 02/07 0700 In: 2588.3 [P.O.:240; I.V.:2348.3] Out: 620 [Urine:600; Blood:20] Intake/Output this shift:    General appearance: alert, cooperative and no distress GI: soft, sore dressings are dry and intact.  tolerating breakfast so far.  Lab Results:   Recent Labs  01/30/16 1735  WBC 20.0*  HGB 13.1  HCT 39.8  PLT 253    BMET  Recent Labs  01/30/16 1735  NA 137  K 4.2  CL 102  CO2 26  GLUCOSE 112*  BUN 13  CREATININE 1.06*  CALCIUM 9.1   PT/INR No results for input(s): LABPROT, INR in the last 72 hours.   Recent Labs Lab 01/30/16 1735  AST 18  ALT 18  ALKPHOS 72  BILITOT 1.5*  PROT 7.5  ALBUMIN 4.4     Lipase     Component Value Date/Time   LIPASE 25 01/30/2016 1735     Studies/Results: Ct Abdomen Pelvis W Contrast  01/30/2016  CLINICAL DATA:  Complains of upper abdominal pain today, nausea. EXAM: CT ABDOMEN AND PELVIS WITH CONTRAST TECHNIQUE: Multidetector CT imaging of the abdomen and pelvis was performed using the standard protocol following bolus administration of intravenous contrast. CONTRAST:  25mL OMNIPAQUE IOHEXOL 300 MG/ML SOLN, OMNIPAQUE IOHEXOL 300 MG/ML SOLN COMPARISON:  None. FINDINGS: BODY WALL: Unremarkable LOWER CHEST: Unremarkable. ABDOMEN/PELVIS: Liver: No focal  abnormality. Biliary: No evidence of biliary obstruction or stone. Pancreas: Unremarkable. Spleen: Unremarkable. Adrenals: Unremarkable. Kidneys and ureters: No hydronephrosis or stone. Bladder: Unremarkable. Reproductive: Unremarkable. RIGHT corpus luteum cyst. IUD appropriately positioned. Bowel: No obstruction. Enlarged fluid-filled appendix, up to 8 mm diameter, mild periappendiceal stranding. Retroperitoneum: No mass or adenopathy. Peritoneum: No free air. Small amount of free fluid considered to be physiologic from corpus luteum cyst. Vascular: No acute abnormality. OSSEOUS: No acute abnormalities. IMPRESSION: Early acute appendicitis.  No perforation. RIGHT corpus luteum cyst.  Small amount of free screw cul-de-sac. These results were called by telephone at the time of interpretation on 01/30/2016 at 7:18 pm to Dr. Radford Pax, who verbally acknowledged these results. Electronically Signed   By: Elsie Stain M.D.   On: 01/30/2016 19:22    Medications: . metronidazole  500 mg Intravenous Q8H   . dextrose 5 % and 0.9 % NaCl with KCl 20 mEq/L 100 mL/hr at 01/31/16 0031   Prior to Admission medications   Medication Sig Start Date End Date Taking? Authorizing Provider  acetaminophen (TYLENOL) 500 MG tablet Take 1,000 mg by mouth every 6 (six) hours as needed (for pain.).   Yes Historical Provider, MD  levonorgestrel (MIRENA, 52 MG,) 20 MCG/24HR IUD 1 Device by Intrauterine route once.    Historical Provider, MD     Assessment/Plan Acute appendicitis S/p laparoscopic appendectomy 01/30/16, Dr. 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          LOS: 1 day    Ammaar Encina 01/31/2016

## 2016-01-31 NOTE — Anesthesia Postprocedure Evaluation (Signed)
Anesthesia Post Note  Patient: Hannah Floyd  Procedure(s) Performed: Procedure(s) (LRB): APPENDECTOMY LAPAROSCOPIC (N/A)  Patient location during evaluation: PACU Anesthesia Type: General Level of consciousness: awake and awake and alert Pain management: pain level controlled Vital Signs Assessment: post-procedure vital signs reviewed and stable Respiratory status: spontaneous breathing and nonlabored ventilation Cardiovascular status: blood pressure returned to baseline Anesthetic complications: no    Last Vitals:  Filed Vitals:   01/30/16 2345 01/31/16 0002  BP: 124/87 110/60  Pulse: 122 114  Temp:  37 C  Resp: 23 20    Last Pain:  Filed Vitals:   01/31/16 0011  PainSc: 3                  Tauna Macfarlane COKER

## 2016-02-02 NOTE — Progress Notes (Addendum)
Physician Discharge Summary  Patient ID: Hannah Floyd MRN: [id number] DOB/AGE: 09/17/1986 29 y.o.  Admit date: 01/30/2016 Discharge date: 01/31/2016  Admission Diagnoses:  Acute appendicitis  Discharge Diagnoses:  Acute appendicitis  Active Problems:   Acute appendicitis   PROCEDURES:  S/p laparoscopic appendectomy 01/30/16, Dr. Hector Shade Course:  She had the onset of upper abdominal pain today that radiated to her lower abdomen and intensified. She had some nausea but no fever or chills. She went to [facility] for evaluation. She was noted to have a leukocytosis while there. CT scan demonstrated a dilated appendix and some early inflammatory changes consistent with acute appendicitis by CT criteria.Urinalysis was negative.  She subsequently was sent here for definitive treatment for her acute appendicitis. She works as a Engineer, civil (consulting) at [employer]. Pt admitted and taken to the OR that evening.  She did well post op and was able to go home the following day.    CBC Latest Ref Rng 01/30/2016  WBC 4.0 - 10.5 K/uL 20.0(H)  Hemoglobin 12.0 - 15.0 g/dL 56.2  Hematocrit 13.0 - 46.0 % 39.8  Platelets 150 - 400 K/uL 253   CMP Latest Ref Rng 01/30/2016  Glucose 65 - 99 mg/dL 865(H)  BUN 6 - 20 mg/dL 13  Creatinine 8.46 - 9.62 mg/dL 9.52(W)  Sodium 413 - 244 mmol/L 137  Potassium 3.5 - 5.1 mmol/L 4.2  Chloride 101 - 111 mmol/L 102  CO2 22 - 32 mmol/L 26  Calcium 8.9 - 10.3 mg/dL 9.1  Total Protein 6.5 - 8.1 g/dL 7.5  Total Bilirubin 0.3 - 1.2 mg/dL 0.1(U)  Alkaline Phos 38 - 126 U/L 72  AST 15 - 41 U/L 18  ALT 14 - 54 U/L 18    Condition on D/C:  Improved  Disposition: 01-Home or Self Care     Medication List    TAKE these medications        acetaminophen 500 MG tablet  Commonly known as:  TYLENOL  Take 1,000 mg by mouth every 6 (six) hours as needed (for pain.).     HYDROcodone-acetaminophen 5-325 MG tablet  Commonly known as:   NORCO/VICODIN  Take 1-2 tablets by mouth every 4 (four) hours as needed for moderate pain.     MIRENA (52 MG) 20 MCG/24HR IUD  Generic drug:  levonorgestrel  1 Device by Intrauterine route once.           Follow-up Information    Follow up with [facility].   Specialty:  General Surgery   Why:  Our office will call with date and time, be at the office 30 minutes early for check in.   Contact information:   [address] [place] [address] [phone]       Follow up with [facility].   Why:  please call this number, follow the prompts to secure a primary care physician   Contact information:   [phone]      Signed: Sherrie George 02/02/2016, 12:00 PM  Agree with above.  Ovidio Kin, MD, [facility] Pager: [phone] Office phone:  [phone]

## 2016-02-06 NOTE — Discharge Summary (Signed)
Patient ID: IVIS NICOLSON MRN: [id number] DOB/AGE: 27-Dec-1985 29 y.o.  Admit date: 01/30/2016 Discharge date: 01/31/2016  Admission Diagnoses:  Acute appendicitis  Discharge Diagnoses:  Acute appendicitis  Active Problems:  Acute appendicitis   PROCEDURES:  S/p laparoscopic appendectomy 01/30/16, Dr. Hector Shade Course:  She had the onset of upper abdominal pain today that radiated to her lower abdomen and intensified. She had some nausea but no fever or chills. She went to [facility] for evaluation. She was noted to have a leukocytosis while there. CT scan demonstrated a dilated appendix and some early inflammatory changes consistent with acute appendicitis by CT criteria.Urinalysis was negative.   She subsequently was sent here for definitive treatment for her acute appendicitis. She works as a Engineer, civil (consulting) at [employer]. Pt admitted and taken to the OR that evening. She did well post op and was able to go home the following day.   CBC Latest Ref Rng 01/30/2016  WBC 4.0 - 10.5 K/uL 20.0(H)  Hemoglobin 12.0 - 15.0 g/dL 40.9  Hematocrit 81.1 - 46.0 % 39.8  Platelets 150 - 400 K/uL 253   CMP Latest Ref Rng 01/30/2016  Glucose 65 - 99 mg/dL 914(N)  BUN 6 - 20 mg/dL 13  Creatinine 8.29 - 5.62 mg/dL 1.30(Q)  Sodium 657 - 846 mmol/L 137  Potassium 3.5 - 5.1 mmol/L 4.2  Chloride 101 - 111 mmol/L 102  CO2 22 - 32 mmol/L 26  Calcium 8.9 - 10.3 mg/dL 9.1  Total Protein 6.5 - 8.1 g/dL 7.5  Total Bilirubin 0.3 - 1.2 mg/dL 9.6(E)  Alkaline Phos 38 - 126 U/L 72  AST 15 - 41 U/L 18  ALT 14 - 54 U/L 18    Condition on D/C: Improved  Disposition: 01-Home or Self Care     Medication List    TAKE these medications       acetaminophen 500 MG tablet  Commonly known as: TYLENOL  Take 1,000 mg by mouth every 6 (six) hours as needed (for pain.).     HYDROcodone-acetaminophen 5-325  MG tablet  Commonly known as: NORCO/VICODIN  Take 1-2 tablets by mouth every 4 (four) hours as needed for moderate pain.     MIRENA (52 MG) 20 MCG/24HR IUD  Generic drug: levonorgestrel  1 Device by Intrauterine route once.           Follow-up Information    Follow up with [facility].   Specialty: General Surgery   Why: Our office will call with date and time, be at the office 30 minutes early for check in.   Contact information:   [address] [place] [address] [phone]       Follow up with [facility].   Why: please call this number, follow the prompts to secure a primary care physician   Contact information:   [phone]      Signed: Sherrie George 02/02/2016, 12:00 PM  Agree with above.  Ovidio Kin, MD, [facility] Pager: [phone] Office phone: [phone]       Revision History     Date/Time User Provider Type Action   02/04/2016 7:07 PM Ovidio Kin, MD Physician Addend   02/04/2016 7:07 PM Ovidio Kin, MD Physician Sign   02/02/2016 12:07 PM Sherrie George, PA-C Physician Assistant Sign   View Details Report             I have signed this once before.  Now I am being  asked to sign it again.  I am not sure what is going on with Epic.  Ovidio Kin, MD, [facility] Pager: [phone] Office phone:  [phone]

## 2016-05-22 DIAGNOSIS — H00015 Hordeolum externum left lower eyelid: Secondary | ICD-10-CM | POA: Diagnosis not present

## 2016-12-11 DIAGNOSIS — Z1151 Encounter for screening for human papillomavirus (HPV): Secondary | ICD-10-CM | POA: Diagnosis not present

## 2016-12-11 DIAGNOSIS — Z01419 Encounter for gynecological examination (general) (routine) without abnormal findings: Secondary | ICD-10-CM | POA: Diagnosis not present

## 2016-12-11 DIAGNOSIS — Z30432 Encounter for removal of intrauterine contraceptive device: Secondary | ICD-10-CM | POA: Diagnosis not present

## 2017-04-23 ENCOUNTER — Ambulatory Visit (INDEPENDENT_AMBULATORY_CARE_PROVIDER_SITE_OTHER): Payer: 59 | Admitting: Family Medicine

## 2017-04-23 ENCOUNTER — Encounter: Payer: Self-pay | Admitting: Family Medicine

## 2017-04-23 VITALS — BP 117/78 | HR 87 | Temp 98.2°F | Resp 16 | Ht 60.75 in | Wt 173.4 lb

## 2017-04-23 DIAGNOSIS — Z131 Encounter for screening for diabetes mellitus: Secondary | ICD-10-CM

## 2017-04-23 DIAGNOSIS — E668 Other obesity: Secondary | ICD-10-CM | POA: Diagnosis not present

## 2017-04-23 DIAGNOSIS — Z0000 Encounter for general adult medical examination without abnormal findings: Secondary | ICD-10-CM

## 2017-04-23 DIAGNOSIS — E039 Hypothyroidism, unspecified: Secondary | ICD-10-CM

## 2017-04-23 NOTE — Progress Notes (Signed)
Chief Complaint  Patient presents with  . Annual Exam    no pap    Subjective:  Hannah Floyd is a 31 y.o. female here for a health maintenance visit.  Patient is new pt  Pt reports that she was being treated for hypothyroidism and stopped the medications a few years ago She reports that she was diagnosed after her first pregnancy but was not on any medication during her second pregnancy or after. Her children are 20 yo and 59 yo.   There are no active problems to display for this patient.   No past medical history on file.  Past Surgical History:  Procedure Laterality Date  . CESAREAN SECTION    . LAPAROSCOPIC APPENDECTOMY N/A 01/30/2016   Procedure: APPENDECTOMY LAPAROSCOPIC;  Surgeon: Jackolyn Confer, MD;  Location: [facility];  Service: General;  Laterality: N/A;     Outpatient Medications Prior to Visit  Medication Sig Dispense Refill  . acetaminophen (TYLENOL) 500 MG tablet Take 1,000 mg by mouth every 6 (six) hours as needed (for pain.).    Marland Kitchen HYDROcodone-acetaminophen (NORCO/VICODIN) 5-325 MG tablet Take 1-2 tablets by mouth every 4 (four) hours as needed for moderate pain. (Patient not taking: Reported on 04/23/2017) 40 tablet 0  . levonorgestrel (MIRENA, 52 MG,) 20 MCG/24HR IUD 1 Device by Intrauterine route once.     No facility-administered medications prior to visit.     No Known Allergies   Family History  Problem Relation Age of Onset  . Hypertension Mother   . Hyperlipidemia Mother   . COPD Mother      Health Habits: Dental Exam: up to date Eye Exam: not up to date Exercise: 0 times/week on average Current exercise activities: none  Diet: balanced  Social History   Social History  . Marital status: Single    Spouse name: N/A  . Number of children: N/A  . Years of education: N/A   Occupational History  . Not on file.   Social History Main Topics  . Smoking status: Never Smoker  . Smokeless tobacco: Never Used  . Alcohol use No  . Drug use:  No  . Sexual activity: Not on file   Other Topics Concern  . Not on file   Social History Narrative  . No narrative on file   History  Alcohol Use No   History  Smoking Status  . Never Smoker  Smokeless Tobacco  . Never Used   History  Drug Use No    GYN: Sexual Health Menstrual status: regular menses LMP: Patient's last menstrual period was 04/09/2017. Last pap smear: see HM section History of abnormal pap smears: 2007 s/p LEEP, now pap smears have been normal. Done at gynecology Sexually active:  with female partner Current contraception: no contraception  Health Maintenance: See under health Maintenance activity for review of completion dates as well. Immunization History  Administered Date(s) Administered  . PPD Test 12/17/2011, 12/30/2011, 12/21/2012, 08/02/2013      Depression Screen-PHQ2/9 Depression screen PHQ 2/9 04/23/2017  Decreased Interest 0  Down, Depressed, Hopeless 0  PHQ - 2 Score 0       Depression Severity and Treatment Recommendations:  0-4= None  5-9= Mild / Treatment: Support, educate to call if worse; return in one month  10-14= Moderate / Treatment: Support, watchful waiting; Antidepressant or Psycotherapy  15-19= Moderately severe / Treatment: Antidepressant OR Psychotherapy  >= 20 = Major depression, severe / Antidepressant AND Psychotherapy    Review of Systems  Review of Systems  Constitutional: Negative for chills, fever and weight loss.  HENT: Negative for congestion, hearing loss, sinus pain and tinnitus.   Eyes: Negative for blurred vision, double vision and photophobia.  Respiratory: Negative for cough, shortness of breath and wheezing.   Cardiovascular: Negative for chest pain, palpitations and orthopnea.  Gastrointestinal: Negative for abdominal pain, constipation, diarrhea, nausea and vomiting.  Genitourinary: Negative for dysuria, frequency and urgency.  Musculoskeletal: Negative for back pain, joint pain and  myalgias.  Skin: Negative for itching and rash.  Neurological: Negative for dizziness, tingling and headaches.  Psychiatric/Behavioral: Negative for depression. The patient is not nervous/anxious and does not have insomnia.     See HPI for ROS as well.    Objective:   Vitals:   04/23/17 1008  BP: 117/78  Pulse: 87  Resp: 16  Temp: 98.2 F (36.8 C)  TempSrc: Oral  SpO2: 98%  Weight: 173 lb 6.4 oz (78.7 kg)  Height: 5' 0.75" (1.543 m)    Body mass index is 33.03 kg/m.  Physical Exam  Constitutional: She is oriented to person, place, and time. She appears well-developed and well-nourished.  HENT:  Head: Normocephalic and atraumatic.  Right Ear: External ear normal.  Left Ear: External ear normal.  Nose: Nose normal.  Mouth/Throat: Oropharynx is clear and moist.  Eyes: Conjunctivae and EOM are normal. Pupils are equal, round, and reactive to light.  Neck: Normal range of motion. Neck supple.  Cardiovascular: Normal rate, regular rhythm and normal heart sounds.   Pulmonary/Chest: Effort normal and breath sounds normal. No respiratory distress. She has no wheezes. She has no rales.  Abdominal: Soft. Bowel sounds are normal. She exhibits no distension. There is no tenderness.  Musculoskeletal: Normal range of motion. She exhibits no edema, tenderness or deformity.  Neurological: She is alert and oriented to person, place, and time. She has normal reflexes. No cranial nerve deficit.  Skin: Skin is warm. No erythema.  Psychiatric: She has a normal mood and affect. Her behavior is normal. Judgment and thought content normal.      Assessment/Plan:   Patient was seen for a health maintenance exam.  Counseled the patient on health maintenance issues. Reviewed her health mainteance schedule and ordered appropriate tests (see orders.) Counseled on regular exercise and weight management. Recommend regular eye exams and dental cleaning.   The following issues were addressed today  for health maintenance:   Hannah Floyd was seen today for annual exam.  Diagnoses and all orders for this visit:  Encounter for health maintenance examination in adult- discussed age appropriate reviews -     CBC with Differential/Platelet -     Lipid panel -     TSH -     Hemoglobin A1c  Acquired hypothyroidism- will assess levels and if TSH is in an appropriate dose  -     Lipid panel -     TSH  Screening for diabetes mellitus- discussed family history, with obesity as a problem then will screen for diabetes -     Hemoglobin A1c  Moderate obesity- discussed diet and exercise    Return in about 1 year (around 04/23/2018).    Body mass index is 33.03 kg/m.:  Discussed the patient's BMI with patient. The BMI body mass index is 33.03 kg/m.     No future appointments.  Patient Instructions       IF you received an x-ray today, you will receive an invoice from [facility]. Please contact [facility] at  [phone] with questions or concerns regarding your invoice.   IF you received labwork today, you will receive an invoice from [facility]. Please contact [facility] at [phone] with questions or concerns regarding your invoice.   Our billing staff will not be able to assist you with questions regarding bills from these companies.  You will be contacted with the lab results as soon as they are available. The fastest way to get your results is to activate your My Chart account. Instructions are located on the last page of this paperwork. If you have not heard from [place] regarding the results in 2 weeks, please contact this office.     Health Maintenance, Female Adopting a healthy lifestyle and getting preventive care can go a long way to promote health and wellness. Talk with your health care provider about what schedule of regular examinations is right for you. This is a good chance for you to check in with your provider about disease prevention and staying  healthy. In between checkups, there are plenty of things you can do on your own. Experts have done a lot of research about which lifestyle changes and preventive measures are most likely to keep you healthy. Ask your health care provider for more information. Weight and diet Eat a healthy diet  Be sure to include plenty of vegetables, fruits, low-fat dairy products, and lean protein.  Do not eat a lot of foods high in solid fats, added sugars, or salt.  Get regular exercise. This is one of the most important things you can do for your health.  Most adults should exercise for at least 150 minutes each week. The exercise should increase your heart rate and make you sweat (moderate-intensity exercise).  Most adults should also do strengthening exercises at least twice a week. This is in addition to the moderate-intensity exercise. Maintain a healthy weight  Body mass index (BMI) is a measurement that can be used to identify possible weight problems. It estimates body fat based on height and weight. Your health care provider can help determine your BMI and help you achieve or maintain a healthy weight.  For females 69 years of age and older:  A BMI below 18.5 is considered underweight.  A BMI of 18.5 to 24.9 is normal.  A BMI of 25 to 29.9 is considered overweight.  A BMI of 30 and above is considered obese. Watch levels of cholesterol and blood lipids  You should start having your blood tested for lipids and cholesterol at 33 years of age, then have this test every 5 years.  You may need to have your cholesterol levels checked more often if:  Your lipid or cholesterol levels are high.  You are older than 33 years of age.  You are at high risk for heart disease. Cancer screening Lung Cancer  Lung cancer screening is recommended for adults 7-8 years old who are at high risk for lung cancer because of a history of smoking.  A yearly low-dose CT scan of the lungs is recommended  for people who:  Currently smoke.  Have quit within the past 15 years.  Have at least a 30-pack-year history of smoking. A pack year is smoking an average of one pack of cigarettes a day for 1 year.  Yearly screening should continue until it has been 15 years since you quit.  Yearly screening should stop if you develop a health problem that would prevent you from having lung cancer treatment. Breast Cancer  Practice breast self-awareness.  This means understanding how your breasts normally appear and feel.  It also means doing regular breast self-exams. Let your health care provider know about any changes, no matter how small.  If you are in your 20s or 30s, you should have a clinical breast exam (CBE) by a health care provider every 1-3 years as part of a regular health exam.  If you are 55 or older, have a CBE every year. Also consider having a breast X-ray (mammogram) every year.  If you have a family history of breast cancer, talk to your health care provider about genetic screening.  If you are at high risk for breast cancer, talk to your health care provider about having an MRI and a mammogram every year.  Breast cancer gene (BRCA) assessment is recommended for women who have family members with BRCA-related cancers. BRCA-related cancers include:  Breast.  Ovarian.  Tubal.  Peritoneal cancers.  Results of the assessment will determine the need for genetic counseling and BRCA1 and BRCA2 testing. Cervical Cancer  Your health care provider may recommend that you be screened regularly for cancer of the pelvic organs (ovaries, uterus, and vagina). This screening involves a pelvic examination, including checking for microscopic changes to the surface of your cervix (Pap test). You may be encouraged to have this screening done every 3 years, beginning at age 27.  For women ages 70-65, health care providers may recommend pelvic exams and Pap testing every 3 years, or they may  recommend the Pap and pelvic exam, combined with testing for human papilloma virus (HPV), every 5 years. Some types of HPV increase your risk of cervical cancer. Testing for HPV may also be done on women of any age with unclear Pap test results.  Other health care providers may not recommend any screening for nonpregnant women who are considered low risk for pelvic cancer and who do not have symptoms. Ask your health care provider if a screening pelvic exam is right for you.  If you have had past treatment for cervical cancer or a condition that could lead to cancer, you need Pap tests and screening for cancer for at least 20 years after your treatment. If Pap tests have been discontinued, your risk factors (such as having a new sexual partner) need to be reassessed to determine if screening should resume. Some women have medical problems that increase the chance of getting cervical cancer. In these cases, your health care provider may recommend more frequent screening and Pap tests. Colorectal Cancer  This type of cancer can be detected and often prevented.  Routine colorectal cancer screening usually begins at 33 years of age and continues through 33 years of age.  Your health care provider may recommend screening at an earlier age if you have risk factors for colon cancer.  Your health care provider may also recommend using home test kits to check for hidden blood in the stool.  A small camera at the end of a tube can be used to examine your colon directly (sigmoidoscopy or colonoscopy). This is done to check for the earliest forms of colorectal cancer.  Routine screening usually begins at age 59.  Direct examination of the colon should be repeated every 5-10 years through 33 years of age. However, you may need to be screened more often if early forms of precancerous polyps or small growths are found. Skin Cancer  Check your skin from head to toe regularly.  Tell your health care provider  about any new moles  or changes in moles, especially if there is a change in a mole's shape or color.  Also tell your health care provider if you have a mole that is larger than the size of a pencil eraser.  Always use sunscreen. Apply sunscreen liberally and repeatedly throughout the day.  Protect yourself by wearing long sleeves, pants, a wide-brimmed hat, and sunglasses whenever you are outside. Heart disease, diabetes, and high blood pressure  High blood pressure causes heart disease and increases the risk of stroke. High blood pressure is more likely to develop in:  People who have blood pressure in the high end of the normal range (130-139/85-89 mm Hg).  People who are overweight or obese.  People who are African American.  If you are 11-40 years of age, have your blood pressure checked every 3-5 years. If you are 15 years of age or older, have your blood pressure checked every year. You should have your blood pressure measured twice-once when you are at a hospital or clinic, and once when you are not at a hospital or clinic. Record the average of the two measurements. To check your blood pressure when you are not at a hospital or clinic, you can use:  An automated blood pressure machine at a pharmacy.  A home blood pressure monitor.  If you are between 87 years and 43 years old, ask your health care provider if you should take aspirin to prevent strokes.  Have regular diabetes screenings. This involves taking a blood sample to check your fasting blood sugar level.  If you are at a normal weight and have a low risk for diabetes, have this test once every three years after 33 years of age.  If you are overweight and have a high risk for diabetes, consider being tested at a younger age or more often. Preventing infection Hepatitis B  If you have a higher risk for hepatitis B, you should be screened for this virus. You are considered at high risk for hepatitis B if:  You were  born in a country where hepatitis B is common. Ask your health care provider which countries are considered high risk.  Your parents were born in a high-risk country, and you have not been immunized against hepatitis B (hepatitis B vaccine).  You have HIV or AIDS.  You use needles to inject street drugs.  You live with someone who has hepatitis B.  You have had sex with someone who has hepatitis B.  You get hemodialysis treatment.  You take certain medicines for conditions, including cancer, organ transplantation, and autoimmune conditions. Hepatitis C  Blood testing is recommended for:  Everyone born from [birth year] through 1967.  Anyone with known risk factors for hepatitis C. Sexually transmitted infections (STIs)  You should be screened for sexually transmitted infections (STIs) including gonorrhea and chlamydia if:  You are sexually active and are younger than 33 years of age.  You are older than 33 years of age and your health care provider tells you that you are at risk for this type of infection.  Your sexual activity has changed since you were last screened and you are at an increased risk for chlamydia or gonorrhea. Ask your health care provider if you are at risk.  If you do not have HIV, but are at risk, it may be recommended that you take a prescription medicine daily to prevent HIV infection. This is called pre-exposure prophylaxis (PrEP). You are considered at risk if:  You are sexually  active and do not regularly use condoms or know the HIV status of your partner(s).  You take drugs by injection.  You are sexually active with a partner who has HIV. Talk with your health care provider about whether you are at high risk of being infected with HIV. If you choose to begin PrEP, you should first be tested for HIV. You should then be tested every 3 months for as long as you are taking PrEP. Pregnancy  If you are premenopausal and you may become pregnant, ask your health  care provider about preconception counseling.  If you may become pregnant, take 400 to 800 micrograms (mcg) of folic acid every day.  If you want to prevent pregnancy, talk to your health care provider about birth control (contraception). Osteoporosis and menopause  Osteoporosis is a disease in which the bones lose minerals and strength with aging. This can result in serious bone fractures. Your risk for osteoporosis can be identified using a bone density scan.  If you are 45 years of age or older, or if you are at risk for osteoporosis and fractures, ask your health care provider if you should be screened.  Ask your health care provider whether you should take a calcium or vitamin D supplement to lower your risk for osteoporosis.  Menopause may have certain physical symptoms and risks.  Hormone replacement therapy may reduce some of these symptoms and risks. Talk to your health care provider about whether hormone replacement therapy is right for you. Follow these instructions at home:  Schedule regular health, dental, and eye exams.  Stay current with your immunizations.  Do not use any tobacco products including cigarettes, chewing tobacco, or electronic cigarettes.  If you are pregnant, do not drink alcohol.  If you are breastfeeding, limit how much and how often you drink alcohol.  Limit alcohol intake to no more than 1 drink per day for nonpregnant women. One drink equals 12 ounces of beer, 5 ounces of wine, or 1 ounces of hard liquor.  Do not use street drugs.  Do not share needles.  Ask your health care provider for help if you need support or information about quitting drugs.  Tell your health care provider if you often feel depressed.  Tell your health care provider if you have ever been abused or do not feel safe at home. This information is not intended to replace advice given to you by your health care provider. Make sure you discuss any questions you have with  your health care provider. Document Released: 06/25/2011 Document Revised: 05/17/2016 Document Reviewed: 09/13/2015 Elsevier Interactive Patient Education  2017 [employer].

## 2017-04-23 NOTE — Patient Instructions (Addendum)
IF you received an x-ray today, you will receive an invoice from [facility]: [facility]. Please contact [facility] at [phone] with questions or concerns regarding your invoice.   IF you received labwork today, you will receive an invoice from [facility]. Please contact [facility] at [phone] with questions or concerns regarding your invoice.   Our billing staff will not be able to assist you with questions regarding bills from these companies.  You will be contacted with the lab results as soon as they are available. The fastest way to get your results is to activate your My Chart account. Instructions are located on the last page of this paperwork. If you have not heard from [place] regarding the results in 2 weeks, please contact this office.     Health Maintenance, Female Adopting a healthy lifestyle and getting preventive care can go a long way to promote health and wellness. Talk with your health care provider about what schedule of regular examinations is right for you. This is a good chance for you to check in with your provider about disease prevention and staying healthy. In between checkups, there are plenty of things you can do on your own. Experts have done a lot of research about which lifestyle changes and preventive measures are most likely to keep you healthy. Ask your health care provider for more information. Weight and diet Eat a healthy diet  Be sure to include plenty of vegetables, fruits, low-fat dairy products, and lean protein.  Do not eat a lot of foods high in solid fats, added sugars, or salt.  Get regular exercise. This is one of the most important things you can do for your health.  Most adults should exercise for at least 150 minutes each week. The exercise should increase your heart rate and make you sweat (moderate-intensity exercise).  Most adults should also do strengthening exercises at least twice a week. This is in addition to the  moderate-intensity exercise. Maintain a healthy weight  Body mass index (BMI) is a measurement that can be used to identify possible weight problems. It estimates body fat based on height and weight. Your health care provider can help determine your BMI and help you achieve or maintain a healthy weight.  For females 93 years of age and older:  A BMI below 18.5 is considered underweight.  A BMI of 18.5 to 24.9 is normal.  A BMI of 25 to 29.9 is considered overweight.  A BMI of 30 and above is considered obese. Watch levels of cholesterol and blood lipids  You should start having your blood tested for lipids and cholesterol at 33 years of age, then have this test every 5 years.  You may need to have your cholesterol levels checked more often if:  Your lipid or cholesterol levels are high.  You are older than 33 years of age.  You are at high risk for heart disease. Cancer screening Lung Cancer  Lung cancer screening is recommended for adults 100-64 years old who are at high risk for lung cancer because of a history of smoking.  A yearly low-dose CT scan of the lungs is recommended for people who:  Currently smoke.  Have quit within the past 15 years.  Have at least a 30-pack-year history of smoking. A pack year is smoking an average of one pack of cigarettes a day for 1 year.  Yearly screening should continue until it has been 15 years since you quit.  Yearly screening should stop  if you develop a health problem that would prevent you from having lung cancer treatment. Breast Cancer  Practice breast self-awareness. This means understanding how your breasts normally appear and feel.  It also means doing regular breast self-exams. Let your health care provider know about any changes, no matter how small.  If you are in your 20s or 30s, you should have a clinical breast exam (CBE) by a health care provider every 1-3 years as part of a regular health exam.  If you are 79 or  older, have a CBE every year. Also consider having a breast X-ray (mammogram) every year.  If you have a family history of breast cancer, talk to your health care provider about genetic screening.  If you are at high risk for breast cancer, talk to your health care provider about having an MRI and a mammogram every year.  Breast cancer gene (BRCA) assessment is recommended for women who have family members with BRCA-related cancers. BRCA-related cancers include:  Breast.  Ovarian.  Tubal.  Peritoneal cancers.  Results of the assessment will determine the need for genetic counseling and BRCA1 and BRCA2 testing. Cervical Cancer  Your health care provider may recommend that you be screened regularly for cancer of the pelvic organs (ovaries, uterus, and vagina). This screening involves a pelvic examination, including checking for microscopic changes to the surface of your cervix (Pap test). You may be encouraged to have this screening done every 3 years, beginning at age 64.  For women ages 74-65, health care providers may recommend pelvic exams and Pap testing every 3 years, or they may recommend the Pap and pelvic exam, combined with testing for human papilloma virus (HPV), every 5 years. Some types of HPV increase your risk of cervical cancer. Testing for HPV may also be done on women of any age with unclear Pap test results.  Other health care providers may not recommend any screening for nonpregnant women who are considered low risk for pelvic cancer and who do not have symptoms. Ask your health care provider if a screening pelvic exam is right for you.  If you have had past treatment for cervical cancer or a condition that could lead to cancer, you need Pap tests and screening for cancer for at least 20 years after your treatment. If Pap tests have been discontinued, your risk factors (such as having a new sexual partner) need to be reassessed to determine if screening should resume. Some  women have medical problems that increase the chance of getting cervical cancer. In these cases, your health care provider may recommend more frequent screening and Pap tests. Colorectal Cancer  This type of cancer can be detected and often prevented.  Routine colorectal cancer screening usually begins at 33 years of age and continues through 33 years of age.  Your health care provider may recommend screening at an earlier age if you have risk factors for colon cancer.  Your health care provider may also recommend using home test kits to check for hidden blood in the stool.  A small camera at the end of a tube can be used to examine your colon directly (sigmoidoscopy or colonoscopy). This is done to check for the earliest forms of colorectal cancer.  Routine screening usually begins at age 45.  Direct examination of the colon should be repeated every 5-10 years through 33 years of age. However, you may need to be screened more often if early forms of precancerous polyps or small growths are found.  Skin Cancer  Check your skin from head to toe regularly.  Tell your health care provider about any new moles or changes in moles, especially if there is a change in a mole's shape or color.  Also tell your health care provider if you have a mole that is larger than the size of a pencil eraser.  Always use sunscreen. Apply sunscreen liberally and repeatedly throughout the day.  Protect yourself by wearing long sleeves, pants, a wide-brimmed hat, and sunglasses whenever you are outside. Heart disease, diabetes, and high blood pressure  High blood pressure causes heart disease and increases the risk of stroke. High blood pressure is more likely to develop in:  People who have blood pressure in the high end of the normal range (130-139/85-89 mm Hg).  People who are overweight or obese.  People who are African American.  If you are 81-65 years of age, have your blood pressure checked every  3-5 years. If you are 51 years of age or older, have your blood pressure checked every year. You should have your blood pressure measured twice-once when you are at a hospital or clinic, and once when you are not at a hospital or clinic. Record the average of the two measurements. To check your blood pressure when you are not at a hospital or clinic, you can use:  An automated blood pressure machine at a pharmacy.  A home blood pressure monitor.  If you are between 66 years and 52 years old, ask your health care provider if you should take aspirin to prevent strokes.  Have regular diabetes screenings. This involves taking a blood sample to check your fasting blood sugar level.  If you are at a normal weight and have a low risk for diabetes, have this test once every three years after 33 years of age.  If you are overweight and have a high risk for diabetes, consider being tested at a younger age or more often. Preventing infection Hepatitis B  If you have a higher risk for hepatitis B, you should be screened for this virus. You are considered at high risk for hepatitis B if:  You were born in a country where hepatitis B is common. Ask your health care provider which countries are considered high risk.  Your parents were born in a high-risk country, and you have not been immunized against hepatitis B (hepatitis B vaccine).  You have HIV or AIDS.  You use needles to inject street drugs.  You live with someone who has hepatitis B.  You have had sex with someone who has hepatitis B.  You get hemodialysis treatment.  You take certain medicines for conditions, including cancer, organ transplantation, and autoimmune conditions. Hepatitis C  Blood testing is recommended for:  Everyone born from [birth year] through 1967.  Anyone with known risk factors for hepatitis C. Sexually transmitted infections (STIs)  You should be screened for sexually transmitted infections (STIs) including  gonorrhea and chlamydia if:  You are sexually active and are younger than 33 years of age.  You are older than 33 years of age and your health care provider tells you that you are at risk for this type of infection.  Your sexual activity has changed since you were last screened and you are at an increased risk for chlamydia or gonorrhea. Ask your health care provider if you are at risk.  If you do not have HIV, but are at risk, it may be recommended that you take a prescription  medicine daily to prevent HIV infection. This is called pre-exposure prophylaxis (PrEP). You are considered at risk if:  You are sexually active and do not regularly use condoms or know the HIV status of your partner(s).  You take drugs by injection.  You are sexually active with a partner who has HIV. Talk with your health care provider about whether you are at high risk of being infected with HIV. If you choose to begin PrEP, you should first be tested for HIV. You should then be tested every 3 months for as long as you are taking PrEP. Pregnancy  If you are premenopausal and you may become pregnant, ask your health care provider about preconception counseling.  If you may become pregnant, take 400 to 800 micrograms (mcg) of folic acid every day.  If you want to prevent pregnancy, talk to your health care provider about birth control (contraception). Osteoporosis and menopause  Osteoporosis is a disease in which the bones lose minerals and strength with aging. This can result in serious bone fractures. Your risk for osteoporosis can be identified using a bone density scan.  If you are 72 years of age or older, or if you are at risk for osteoporosis and fractures, ask your health care provider if you should be screened.  Ask your health care provider whether you should take a calcium or vitamin D supplement to lower your risk for osteoporosis.  Menopause may have certain physical symptoms and risks.  Hormone  replacement therapy may reduce some of these symptoms and risks. Talk to your health care provider about whether hormone replacement therapy is right for you. Follow these instructions at home:  Schedule regular health, dental, and eye exams.  Stay current with your immunizations.  Do not use any tobacco products including cigarettes, chewing tobacco, or electronic cigarettes.  If you are pregnant, do not drink alcohol.  If you are breastfeeding, limit how much and how often you drink alcohol.  Limit alcohol intake to no more than 1 drink per day for nonpregnant women. One drink equals 12 ounces of beer, 5 ounces of wine, or 1 ounces of hard liquor.  Do not use street drugs.  Do not share needles.  Ask your health care provider for help if you need support or information about quitting drugs.  Tell your health care provider if you often feel depressed.  Tell your health care provider if you have ever been abused or do not feel safe at home. This information is not intended to replace advice given to you by your health care provider. Make sure you discuss any questions you have with your health care provider. Document Released: 06/25/2011 Document Revised: 05/17/2016 Document Reviewed: 09/13/2015 Elsevier Interactive Patient Education  2017 [employer].

## 2017-04-24 LAB — CBC WITH DIFFERENTIAL/PLATELET
Basophils Absolute: 0 10*3/uL (ref 0.0–0.2)
Basos: 0 %
EOS (ABSOLUTE): 0.1 10*3/uL (ref 0.0–0.4)
EOS: 1 %
Hematocrit: 41.3 % (ref 34.0–46.6)
Hemoglobin: 13.5 g/dL (ref 11.1–15.9)
Immature Grans (Abs): 0 10*3/uL (ref 0.0–0.1)
Immature Granulocytes: 0 %
LYMPHS: 24 %
Lymphocytes Absolute: 2.1 10*3/uL (ref 0.7–3.1)
MCH: 28.3 pg (ref 26.6–33.0)
MCHC: 32.7 g/dL (ref 31.5–35.7)
MCV: 87 fL (ref 79–97)
MONOS ABS: 0.6 10*3/uL (ref 0.1–0.9)
Monocytes: 6 %
NEUTROS PCT: 69 %
Neutrophils Absolute: 6.1 10*3/uL (ref 1.4–7.0)
PLATELETS: 320 10*3/uL (ref 150–379)
RBC: 4.77 x10E6/uL (ref 3.77–5.28)
RDW: 13.2 % (ref 12.3–15.4)
WBC: 8.9 10*3/uL (ref 3.4–10.8)

## 2017-04-24 LAB — LIPID PANEL
Chol/HDL Ratio: 2.7 ratio (ref 0.0–4.4)
Cholesterol, Total: 134 mg/dL (ref 100–199)
HDL: 49 mg/dL (ref >39)
LDL Calculated: 72 mg/dL (ref 0–99)
TRIGLYCERIDES: 65 mg/dL (ref 0–149)
VLDL Cholesterol Cal: 13 mg/dL (ref 5–40)

## 2017-04-24 LAB — TSH: TSH: 5.87 u[IU]/mL — ABNORMAL HIGH (ref 0.450–4.500)

## 2017-04-24 LAB — HEMOGLOBIN A1C
ESTIMATED AVERAGE GLUCOSE: 103 mg/dL
Hgb A1c MFr Bld: 5.2 % (ref 4.8–5.6)

## 2017-05-17 DIAGNOSIS — N912 Amenorrhea, unspecified: Secondary | ICD-10-CM | POA: Diagnosis not present

## 2017-06-06 DIAGNOSIS — O3680X0 Pregnancy with inconclusive fetal viability, not applicable or unspecified: Secondary | ICD-10-CM | POA: Diagnosis not present

## 2017-06-06 DIAGNOSIS — Z369 Encounter for antenatal screening, unspecified: Secondary | ICD-10-CM | POA: Diagnosis not present

## 2017-06-18 MED FILL — TARON-C DHA CAPSULE: 53.5-38-1 | 90 days supply | Qty: 90 | Fill #0

## 2017-06-28 DIAGNOSIS — Z3689 Encounter for other specified antenatal screening: Secondary | ICD-10-CM | POA: Diagnosis not present

## 2017-06-28 DIAGNOSIS — R7989 Other specified abnormal findings of blood chemistry: Secondary | ICD-10-CM | POA: Diagnosis not present

## 2017-06-28 LAB — OB RESULTS CONSOLE HIV ANTIBODY (ROUTINE TESTING): HIV: NONREACTIVE

## 2017-06-28 LAB — OB RESULTS CONSOLE RUBELLA ANTIBODY, IGM: RUBELLA: IMMUNE

## 2017-06-28 LAB — OB RESULTS CONSOLE HEPATITIS B SURFACE ANTIGEN: HEP B S AG: NEGATIVE

## 2017-06-28 LAB — OB RESULTS CONSOLE ANTIBODY SCREEN: Antibody Screen: NEGATIVE

## 2017-06-28 LAB — OB RESULTS CONSOLE GC/CHLAMYDIA
CHLAMYDIA, DNA PROBE: NEGATIVE
GC PROBE AMP, GENITAL: NEGATIVE

## 2017-06-28 LAB — OB RESULTS CONSOLE ABO/RH: RH Type: POSITIVE

## 2017-06-28 LAB — OB RESULTS CONSOLE RPR: RPR: NONREACTIVE

## 2017-06-28 MED FILL — DICLEGIS DR 10-10 MG TABLET: 10-10 | 30 days supply | Qty: 120 | Fill #0

## 2017-07-01 MED FILL — LEVOTHYROXINE 25 MCG TABLET: 25 | 30 days supply | Qty: 30 | Fill #0

## 2017-07-10 DIAGNOSIS — Z3491 Encounter for supervision of normal pregnancy, unspecified, first trimester: Secondary | ICD-10-CM | POA: Diagnosis not present

## 2017-07-10 DIAGNOSIS — Z36 Encounter for antenatal screening for chromosomal anomalies: Secondary | ICD-10-CM | POA: Diagnosis not present

## 2017-07-10 DIAGNOSIS — Z3481 Encounter for supervision of other normal pregnancy, first trimester: Secondary | ICD-10-CM | POA: Diagnosis not present

## 2017-07-10 DIAGNOSIS — Z3682 Encounter for antenatal screening for nuchal translucency: Secondary | ICD-10-CM | POA: Diagnosis not present

## 2017-07-18 DIAGNOSIS — Z3682 Encounter for antenatal screening for nuchal translucency: Secondary | ICD-10-CM | POA: Diagnosis not present

## 2017-08-23 DIAGNOSIS — Z361 Encounter for antenatal screening for raised alphafetoprotein level: Secondary | ICD-10-CM | POA: Diagnosis not present

## 2017-08-23 DIAGNOSIS — Z3482 Encounter for supervision of other normal pregnancy, second trimester: Secondary | ICD-10-CM | POA: Diagnosis not present

## 2017-08-23 DIAGNOSIS — R7989 Other specified abnormal findings of blood chemistry: Secondary | ICD-10-CM | POA: Diagnosis not present

## 2017-09-01 IMAGING — CT CT ABD-PELV W/ CM
2 of 4 series · 17 of 46 positions shown, 19 images · IV contrast (APPLIED)
Comparison: None.

CLINICAL DATA: Complains of upper abdominal pain today, nausea.

EXAM:
CT ABDOMEN AND PELVIS WITH CONTRAST
TECHNIQUE: Multidetector CT imaging of the abdomen and pelvis was performed
using the standard protocol following bolus administration of
intravenous contrast.
CONTRAST:  25mL OMNIPAQUE IOHEXOL 300 MG/ML SOLN, 100mL OMNIPAQUE
IOHEXOL 300 MG/ML SOLN

[Series 2: axial st · axial · 0.97mm/px · z∈[-422,-32]mm · 14 of 86 slices shown, 16 images]
[im 4/86  soft-tissue]
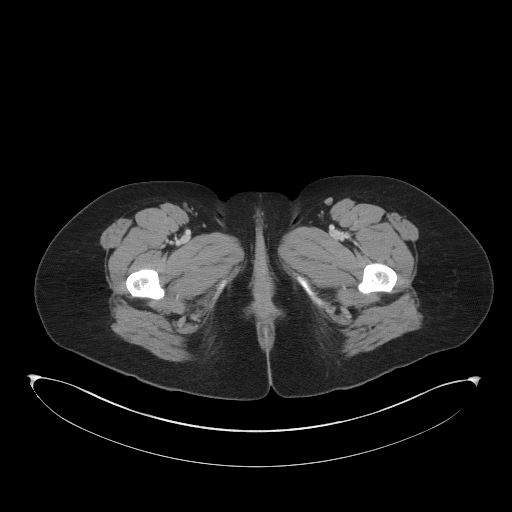
[im 4/86  bone]
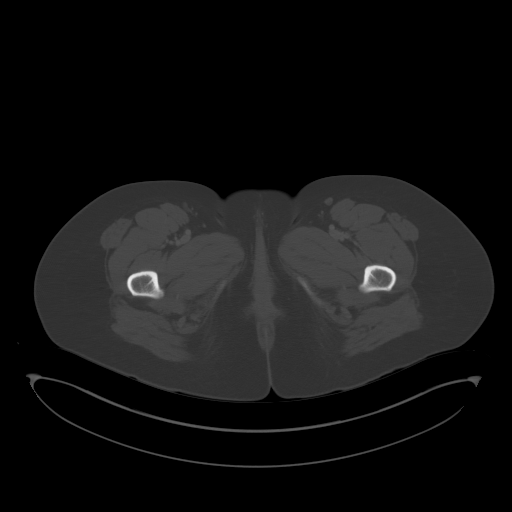
[im 10/86  soft-tissue]
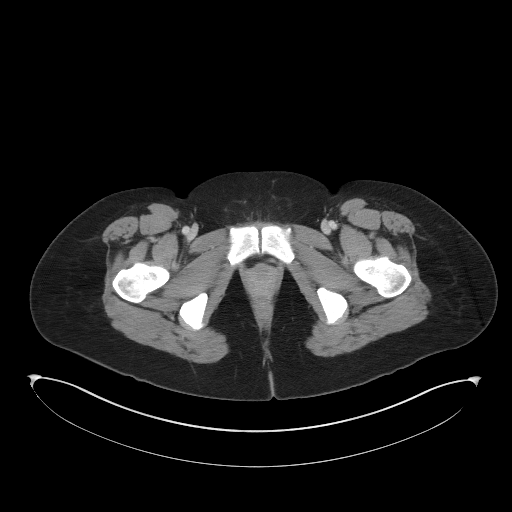
[im 17/86  soft-tissue]
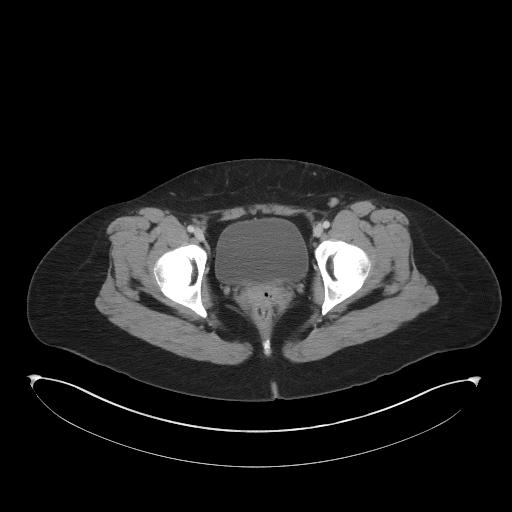
[im 23/86  soft-tissue]
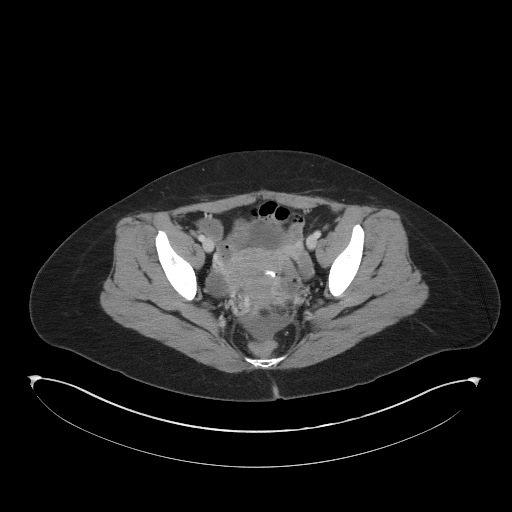
[im 30/86  soft-tissue]
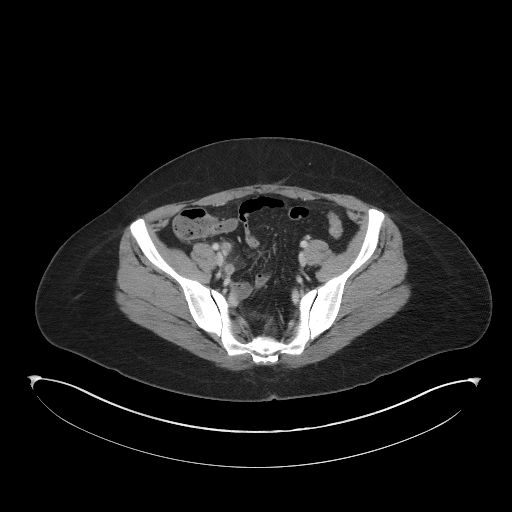
[im 33/86  soft-tissue]
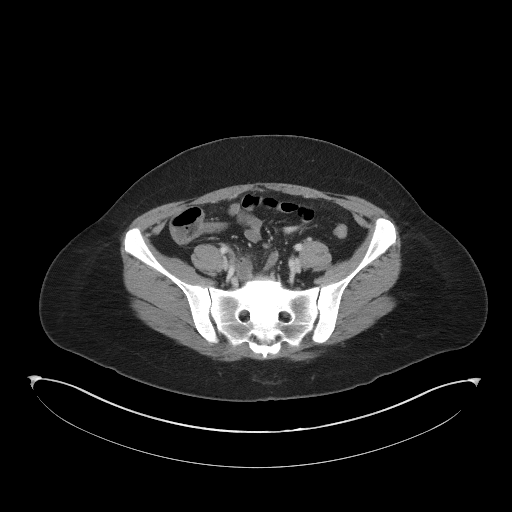
[im 40/86  soft-tissue]
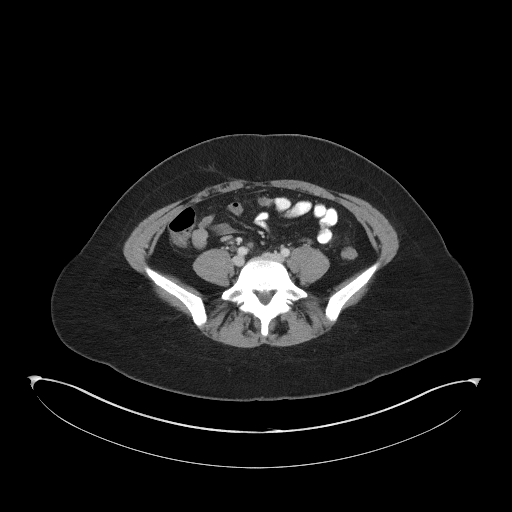
[im 46/86  soft-tissue]
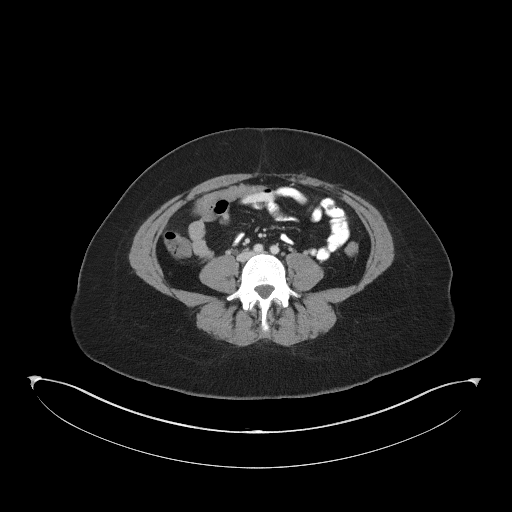
[im 53/86  soft-tissue]
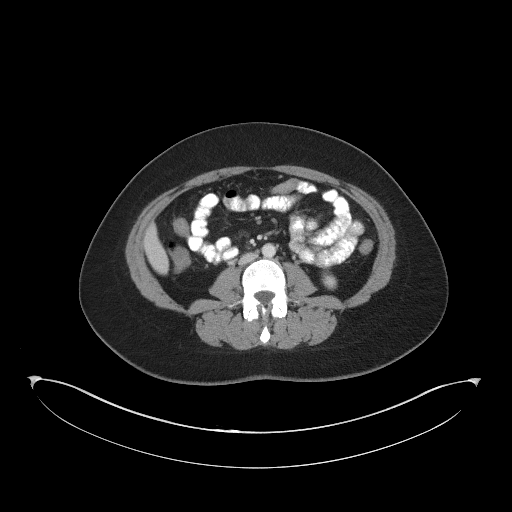
[im 53/86  bone]
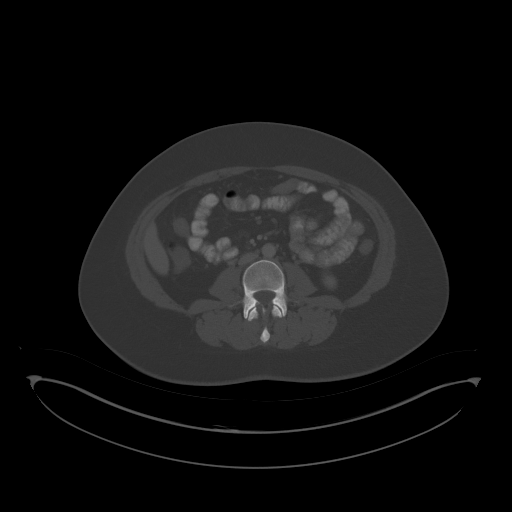
[im 56/86  soft-tissue]
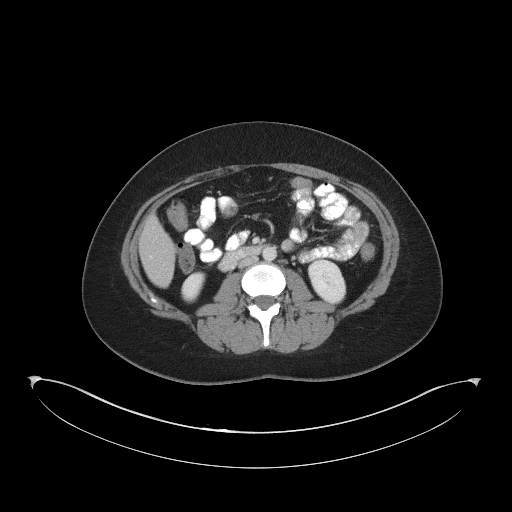
[im 63/86  soft-tissue]
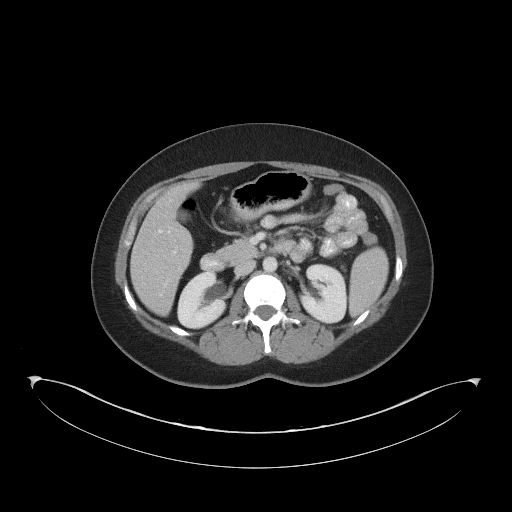
[im 69/86  soft-tissue]
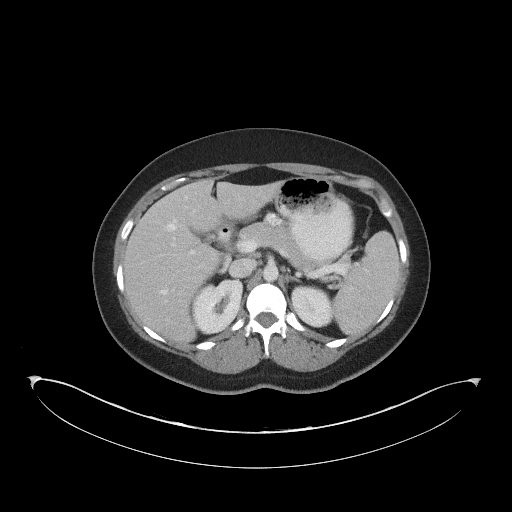
[im 76/86  soft-tissue]
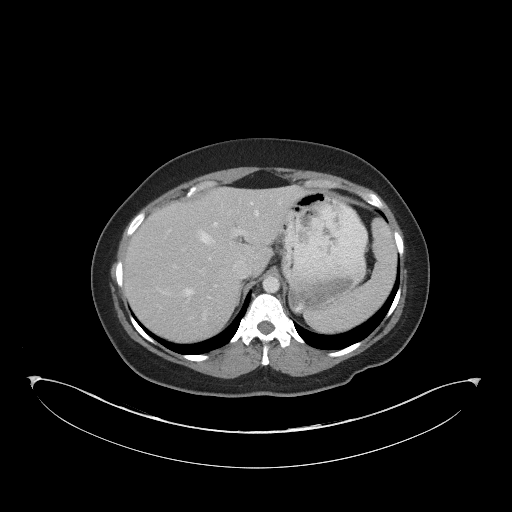
[im 82/86  soft-tissue]
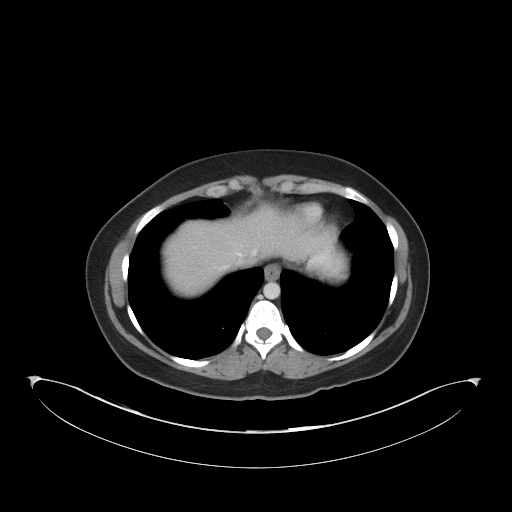

[Series 5: coronal st · coronal · 0.93mm/px · 3 of 82 slices shown]
[im 28/82  soft-tissue]
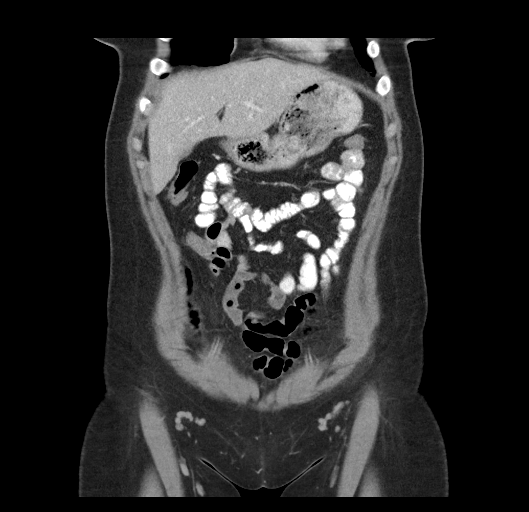
[im 37/82  soft-tissue]
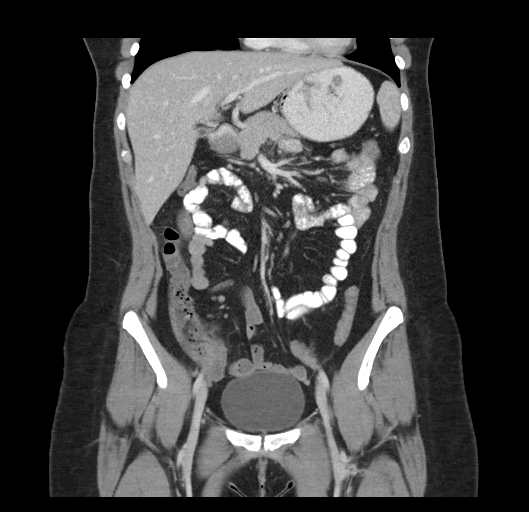
[im 46/82  soft-tissue]
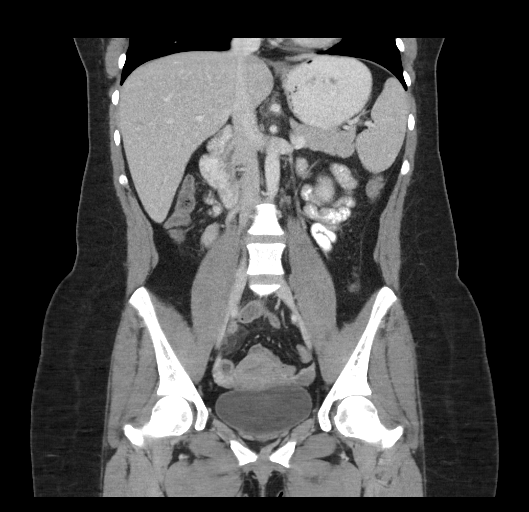

[17 of 46 positions shown; findings below may reference images not displayed]

FINDINGS: BODY WALL: Unremarkable

LOWER CHEST: Unremarkable.

ABDOMEN/PELVIS:

Liver: No focal abnormality.

Biliary: No evidence of biliary obstruction or stone.

Pancreas: Unremarkable.

Spleen: Unremarkable.

Adrenals: Unremarkable.

Kidneys and ureters: No hydronephrosis or stone.

Bladder: Unremarkable.

Reproductive: Unremarkable. RIGHT corpus luteum cyst. IUD
appropriately positioned.

Bowel: No obstruction. Enlarged fluid-filled appendix, up to 8 mm
diameter, mild periappendiceal stranding.

Retroperitoneum: No mass or adenopathy.

Peritoneum: No free air. Small amount of free fluid considered to be
physiologic from corpus luteum cyst.

Vascular: No acute abnormality.

OSSEOUS: No acute abnormalities.
IMPRESSION: Early acute appendicitis.  No perforation.

RIGHT corpus luteum cyst.  Small amount of free screw cul-de-sac.

These results were called by telephone at the time of interpretation
on 01/30/2016 at [DATE] to Dr. ILLUSTRISIMO, who verbally acknowledged
these results.

## 2017-09-02 DIAGNOSIS — Z363 Encounter for antenatal screening for malformations: Secondary | ICD-10-CM | POA: Diagnosis not present

## 2017-09-18 DIAGNOSIS — Z3482 Encounter for supervision of other normal pregnancy, second trimester: Secondary | ICD-10-CM | POA: Diagnosis not present

## 2017-09-18 MED FILL — LEVOTHYROXINE 50 MCG TABLET: 50 | 30 days supply | Qty: 30 | Fill #0

## 2017-09-18 MED FILL — SERTRALINE HCL 50 MG TABLET: 50 | 30 days supply | Qty: 30 | Fill #0

## 2017-09-18 MED FILL — TARON-C DHA CAPSULE: 53.5-38-1 | 90 days supply | Qty: 90 | Fill #1

## 2017-09-26 DIAGNOSIS — Z23 Encounter for immunization: Secondary | ICD-10-CM | POA: Diagnosis not present

## 2017-09-26 DIAGNOSIS — L02439 Carbuncle of limb, unspecified: Secondary | ICD-10-CM | POA: Diagnosis not present

## 2017-09-26 DIAGNOSIS — B373 Candidiasis of vulva and vagina: Secondary | ICD-10-CM | POA: Diagnosis not present

## 2017-09-26 MED FILL — CEPHALEXIN 500 MG CAPSULE: 500 | 7 days supply | Qty: 14 | Fill #0

## 2017-09-26 MED FILL — CLOTRIM 1% VAGINAL CREAM: 1 | 7 days supply | Qty: 45 | Fill #0

## 2017-09-27 MED FILL — TERCONAZOLE 0.4% VAG CREAM: 0.4 | 7 days supply | Qty: 45 | Fill #0

## 2017-10-02 DIAGNOSIS — O26872 Cervical shortening, second trimester: Secondary | ICD-10-CM | POA: Diagnosis not present

## 2017-10-02 DIAGNOSIS — R7989 Other specified abnormal findings of blood chemistry: Secondary | ICD-10-CM | POA: Diagnosis not present

## 2017-10-02 DIAGNOSIS — Z3A23 23 weeks gestation of pregnancy: Secondary | ICD-10-CM | POA: Diagnosis not present

## 2017-10-09 DIAGNOSIS — Z3A24 24 weeks gestation of pregnancy: Secondary | ICD-10-CM | POA: Diagnosis not present

## 2017-10-09 DIAGNOSIS — O3432 Maternal care for cervical incompetence, second trimester: Secondary | ICD-10-CM | POA: Diagnosis not present

## 2017-10-09 MED FILL — PROGESTERONE 200 MG CAPSULE: 200 | 30 days supply | Qty: 30 | Fill #0

## 2017-10-17 DIAGNOSIS — Z3A25 25 weeks gestation of pregnancy: Secondary | ICD-10-CM | POA: Diagnosis not present

## 2017-10-17 DIAGNOSIS — O3440 Maternal care for other abnormalities of cervix, unspecified trimester: Secondary | ICD-10-CM | POA: Diagnosis not present

## 2017-10-17 DIAGNOSIS — O3432 Maternal care for cervical incompetence, second trimester: Secondary | ICD-10-CM | POA: Diagnosis not present

## 2017-10-17 DIAGNOSIS — O4402 Placenta previa specified as without hemorrhage, second trimester: Secondary | ICD-10-CM | POA: Diagnosis not present

## 2017-10-24 DIAGNOSIS — O4402 Placenta previa specified as without hemorrhage, second trimester: Secondary | ICD-10-CM | POA: Diagnosis not present

## 2017-10-24 DIAGNOSIS — Z3689 Encounter for other specified antenatal screening: Secondary | ICD-10-CM | POA: Diagnosis not present

## 2017-10-24 DIAGNOSIS — O3432 Maternal care for cervical incompetence, second trimester: Secondary | ICD-10-CM | POA: Diagnosis not present

## 2017-10-24 DIAGNOSIS — Z3A26 26 weeks gestation of pregnancy: Secondary | ICD-10-CM | POA: Diagnosis not present

## 2017-10-25 ENCOUNTER — Other Ambulatory Visit (HOSPITAL_COMMUNITY): Payer: Self-pay | Admitting: Obstetrics and Gynecology

## 2017-10-25 DIAGNOSIS — Z3689 Encounter for other specified antenatal screening: Secondary | ICD-10-CM

## 2017-10-30 ENCOUNTER — Encounter (HOSPITAL_COMMUNITY): Payer: Self-pay | Admitting: *Deleted

## 2017-10-31 ENCOUNTER — Ambulatory Visit (HOSPITAL_COMMUNITY)
Admission: RE | Admit: 2017-10-31 | Discharge: 2017-10-31 | Disposition: A | Discharge status: Home / Self Care | Payer: 59 | Source: Ambulatory Visit | Attending: Obstetrics and Gynecology | Admitting: Obstetrics and Gynecology

## 2017-10-31 ENCOUNTER — Other Ambulatory Visit (HOSPITAL_COMMUNITY): Payer: Self-pay | Admitting: Obstetrics and Gynecology

## 2017-10-31 ENCOUNTER — Encounter (HOSPITAL_COMMUNITY): Payer: Self-pay

## 2017-10-31 DIAGNOSIS — O99212 Obesity complicating pregnancy, second trimester: Secondary | ICD-10-CM | POA: Diagnosis not present

## 2017-10-31 DIAGNOSIS — O3442 Maternal care for other abnormalities of cervix, second trimester: Secondary | ICD-10-CM | POA: Diagnosis not present

## 2017-10-31 DIAGNOSIS — O26872 Cervical shortening, second trimester: Secondary | ICD-10-CM | POA: Diagnosis not present

## 2017-10-31 DIAGNOSIS — Z3689 Encounter for other specified antenatal screening: Secondary | ICD-10-CM

## 2017-10-31 DIAGNOSIS — Z3A27 27 weeks gestation of pregnancy: Secondary | ICD-10-CM | POA: Diagnosis not present

## 2017-10-31 DIAGNOSIS — Z363 Encounter for antenatal screening for malformations: Secondary | ICD-10-CM | POA: Diagnosis not present

## 2017-10-31 DIAGNOSIS — O4402 Placenta previa specified as without hemorrhage, second trimester: Secondary | ICD-10-CM | POA: Diagnosis not present

## 2017-10-31 DIAGNOSIS — O34219 Maternal care for unspecified type scar from previous cesarean delivery: Secondary | ICD-10-CM | POA: Diagnosis not present

## 2017-10-31 DIAGNOSIS — O3432 Maternal care for cervical incompetence, second trimester: Secondary | ICD-10-CM | POA: Diagnosis not present

## 2017-10-31 DIAGNOSIS — Z23 Encounter for immunization: Secondary | ICD-10-CM | POA: Diagnosis not present

## 2017-10-31 DIAGNOSIS — O365920 Maternal care for other known or suspected poor fetal growth, second trimester, not applicable or unspecified: Secondary | ICD-10-CM | POA: Diagnosis not present

## 2017-10-31 HISTORY — DX: Hypothyroidism, unspecified: E03.9

## 2017-11-06 ENCOUNTER — Encounter (HOSPITAL_COMMUNITY): Payer: Self-pay

## 2017-11-06 DIAGNOSIS — Z3A28 28 weeks gestation of pregnancy: Secondary | ICD-10-CM | POA: Diagnosis not present

## 2017-11-06 DIAGNOSIS — Z3689 Encounter for other specified antenatal screening: Secondary | ICD-10-CM | POA: Diagnosis not present

## 2017-11-06 DIAGNOSIS — O3433 Maternal care for cervical incompetence, third trimester: Secondary | ICD-10-CM | POA: Diagnosis not present

## 2017-11-08 MED FILL — CEPHALEXIN 500 MG CAPSULE: 500 | 7 days supply | Qty: 14 | Fill #0

## 2017-11-15 DIAGNOSIS — O4402 Placenta previa specified as without hemorrhage, second trimester: Secondary | ICD-10-CM | POA: Diagnosis not present

## 2017-11-15 DIAGNOSIS — O365920 Maternal care for other known or suspected poor fetal growth, second trimester, not applicable or unspecified: Secondary | ICD-10-CM | POA: Diagnosis not present

## 2017-11-15 DIAGNOSIS — O3432 Maternal care for cervical incompetence, second trimester: Secondary | ICD-10-CM | POA: Diagnosis not present

## 2017-11-15 DIAGNOSIS — Z3A29 29 weeks gestation of pregnancy: Secondary | ICD-10-CM | POA: Diagnosis not present

## 2017-11-15 DIAGNOSIS — O3433 Maternal care for cervical incompetence, third trimester: Secondary | ICD-10-CM | POA: Diagnosis not present

## 2017-11-20 DIAGNOSIS — O3433 Maternal care for cervical incompetence, third trimester: Secondary | ICD-10-CM | POA: Diagnosis not present

## 2017-11-20 DIAGNOSIS — Z3A30 30 weeks gestation of pregnancy: Secondary | ICD-10-CM | POA: Diagnosis not present

## 2017-11-21 DIAGNOSIS — B373 Candidiasis of vulva and vagina: Secondary | ICD-10-CM | POA: Diagnosis not present

## 2017-11-21 DIAGNOSIS — Z3A30 30 weeks gestation of pregnancy: Secondary | ICD-10-CM | POA: Diagnosis not present

## 2017-11-21 DIAGNOSIS — N760 Acute vaginitis: Secondary | ICD-10-CM | POA: Diagnosis not present

## 2017-11-21 MED FILL — metroNIDAZOLE 500 MG TABS: 500 | 7 days supply | Qty: 14 | Fill #0

## 2017-11-21 MED FILL — FLUCONAZOLE 150 MG TABLET: 150 | 4 days supply | Qty: 2 | Fill #0

## 2017-11-22 DIAGNOSIS — Z3A30 30 weeks gestation of pregnancy: Secondary | ICD-10-CM | POA: Diagnosis not present

## 2017-11-26 DIAGNOSIS — O365930 Maternal care for other known or suspected poor fetal growth, third trimester, not applicable or unspecified: Secondary | ICD-10-CM | POA: Diagnosis not present

## 2017-11-26 DIAGNOSIS — O3433 Maternal care for cervical incompetence, third trimester: Secondary | ICD-10-CM | POA: Diagnosis not present

## 2017-11-26 DIAGNOSIS — O4403 Placenta previa specified as without hemorrhage, third trimester: Secondary | ICD-10-CM | POA: Diagnosis not present

## 2017-11-26 DIAGNOSIS — O26873 Cervical shortening, third trimester: Secondary | ICD-10-CM | POA: Diagnosis not present

## 2017-11-26 DIAGNOSIS — Z3A31 31 weeks gestation of pregnancy: Secondary | ICD-10-CM | POA: Diagnosis not present

## 2017-11-29 DIAGNOSIS — Z3A31 31 weeks gestation of pregnancy: Secondary | ICD-10-CM | POA: Diagnosis not present

## 2017-11-29 DIAGNOSIS — O26873 Cervical shortening, third trimester: Secondary | ICD-10-CM | POA: Diagnosis not present

## 2017-11-29 DIAGNOSIS — O4403 Placenta previa specified as without hemorrhage, third trimester: Secondary | ICD-10-CM | POA: Diagnosis not present

## 2017-11-29 DIAGNOSIS — O365930 Maternal care for other known or suspected poor fetal growth, third trimester, not applicable or unspecified: Secondary | ICD-10-CM | POA: Diagnosis not present

## 2017-11-29 DIAGNOSIS — O3433 Maternal care for cervical incompetence, third trimester: Secondary | ICD-10-CM | POA: Diagnosis not present

## 2017-12-03 DIAGNOSIS — O365930 Maternal care for other known or suspected poor fetal growth, third trimester, not applicable or unspecified: Secondary | ICD-10-CM | POA: Diagnosis not present

## 2017-12-03 DIAGNOSIS — O4403 Placenta previa specified as without hemorrhage, third trimester: Secondary | ICD-10-CM | POA: Diagnosis not present

## 2017-12-03 DIAGNOSIS — Z3A32 32 weeks gestation of pregnancy: Secondary | ICD-10-CM | POA: Diagnosis not present

## 2017-12-06 ENCOUNTER — Other Ambulatory Visit: Payer: Self-pay | Admitting: Obstetrics and Gynecology

## 2017-12-06 DIAGNOSIS — O365930 Maternal care for other known or suspected poor fetal growth, third trimester, not applicable or unspecified: Secondary | ICD-10-CM | POA: Diagnosis not present

## 2017-12-06 DIAGNOSIS — Z3A32 32 weeks gestation of pregnancy: Secondary | ICD-10-CM | POA: Diagnosis not present

## 2017-12-06 DIAGNOSIS — O4403 Placenta previa specified as without hemorrhage, third trimester: Secondary | ICD-10-CM | POA: Diagnosis not present

## 2017-12-06 DIAGNOSIS — O3433 Maternal care for cervical incompetence, third trimester: Secondary | ICD-10-CM | POA: Diagnosis not present

## 2017-12-09 DIAGNOSIS — O365930 Maternal care for other known or suspected poor fetal growth, third trimester, not applicable or unspecified: Secondary | ICD-10-CM | POA: Diagnosis not present

## 2017-12-09 DIAGNOSIS — Z3A33 33 weeks gestation of pregnancy: Secondary | ICD-10-CM | POA: Diagnosis not present

## 2017-12-09 DIAGNOSIS — O4403 Placenta previa specified as without hemorrhage, third trimester: Secondary | ICD-10-CM | POA: Diagnosis not present

## 2017-12-09 DIAGNOSIS — O3433 Maternal care for cervical incompetence, third trimester: Secondary | ICD-10-CM | POA: Diagnosis not present

## 2017-12-09 DIAGNOSIS — O26873 Cervical shortening, third trimester: Secondary | ICD-10-CM | POA: Diagnosis not present

## 2017-12-12 DIAGNOSIS — O365930 Maternal care for other known or suspected poor fetal growth, third trimester, not applicable or unspecified: Secondary | ICD-10-CM | POA: Diagnosis not present

## 2017-12-12 DIAGNOSIS — Z3A33 33 weeks gestation of pregnancy: Secondary | ICD-10-CM | POA: Diagnosis not present

## 2017-12-12 DIAGNOSIS — O3433 Maternal care for cervical incompetence, third trimester: Secondary | ICD-10-CM | POA: Diagnosis not present

## 2017-12-12 DIAGNOSIS — O26873 Cervical shortening, third trimester: Secondary | ICD-10-CM | POA: Diagnosis not present

## 2017-12-12 DIAGNOSIS — O4403 Placenta previa specified as without hemorrhage, third trimester: Secondary | ICD-10-CM | POA: Diagnosis not present

## 2017-12-18 DIAGNOSIS — Z3A34 34 weeks gestation of pregnancy: Secondary | ICD-10-CM | POA: Diagnosis not present

## 2017-12-18 DIAGNOSIS — O26873 Cervical shortening, third trimester: Secondary | ICD-10-CM | POA: Diagnosis not present

## 2017-12-20 ENCOUNTER — Encounter (HOSPITAL_COMMUNITY): Payer: Self-pay

## 2017-12-20 ENCOUNTER — Telehealth (HOSPITAL_COMMUNITY): Payer: Self-pay | Admitting: *Deleted

## 2017-12-20 DIAGNOSIS — O4403 Placenta previa specified as without hemorrhage, third trimester: Secondary | ICD-10-CM | POA: Diagnosis not present

## 2017-12-20 DIAGNOSIS — O365930 Maternal care for other known or suspected poor fetal growth, third trimester, not applicable or unspecified: Secondary | ICD-10-CM | POA: Diagnosis not present

## 2017-12-20 DIAGNOSIS — O3433 Maternal care for cervical incompetence, third trimester: Secondary | ICD-10-CM | POA: Diagnosis not present

## 2017-12-20 DIAGNOSIS — Z3A34 34 weeks gestation of pregnancy: Secondary | ICD-10-CM | POA: Diagnosis not present

## 2017-12-20 DIAGNOSIS — Z3685 Encounter for antenatal screening for Streptococcus B: Secondary | ICD-10-CM | POA: Diagnosis not present

## 2017-12-20 DIAGNOSIS — Z3686 Encounter for antenatal screening for cervical length: Secondary | ICD-10-CM | POA: Diagnosis not present

## 2017-12-20 NOTE — Telephone Encounter (Signed)
Preadmission screen  

## 2017-12-25 ENCOUNTER — Inpatient Hospital Stay (HOSPITAL_COMMUNITY)
Admission: AD | Admit: 2017-12-25 | Discharge: 2017-12-26 | Disposition: A | Discharge status: Home / Self Care | Payer: 59 | Source: Ambulatory Visit | Attending: Obstetrics and Gynecology | Admitting: Obstetrics and Gynecology

## 2017-12-25 ENCOUNTER — Encounter (HOSPITAL_COMMUNITY): Payer: Self-pay

## 2017-12-25 ENCOUNTER — Other Ambulatory Visit: Payer: Self-pay

## 2017-12-25 DIAGNOSIS — O4403 Placenta previa specified as without hemorrhage, third trimester: Secondary | ICD-10-CM | POA: Diagnosis not present

## 2017-12-25 DIAGNOSIS — W19XXXA Unspecified fall, initial encounter: Secondary | ICD-10-CM

## 2017-12-25 DIAGNOSIS — O365930 Maternal care for other known or suspected poor fetal growth, third trimester, not applicable or unspecified: Secondary | ICD-10-CM | POA: Diagnosis not present

## 2017-12-25 DIAGNOSIS — Z043 Encounter for examination and observation following other accident: Secondary | ICD-10-CM | POA: Insufficient documentation

## 2017-12-25 DIAGNOSIS — E039 Hypothyroidism, unspecified: Secondary | ICD-10-CM | POA: Insufficient documentation

## 2017-12-25 DIAGNOSIS — Z3A35 35 weeks gestation of pregnancy: Secondary | ICD-10-CM | POA: Insufficient documentation

## 2017-12-25 DIAGNOSIS — O99283 Endocrine, nutritional and metabolic diseases complicating pregnancy, third trimester: Secondary | ICD-10-CM | POA: Insufficient documentation

## 2017-12-25 DIAGNOSIS — O3433 Maternal care for cervical incompetence, third trimester: Secondary | ICD-10-CM | POA: Diagnosis not present

## 2017-12-25 DIAGNOSIS — Z3686 Encounter for antenatal screening for cervical length: Secondary | ICD-10-CM | POA: Diagnosis not present

## 2017-12-25 NOTE — MAU Note (Signed)
Pt states that she was at her son's basketball practice at 2000 when she went to pick up a ball and did not realize a child had wrapped his arms around her leg. She states she shuffled a few feet trying to catch her balance when she landed on her knees. She states she did not fall on her stomach. She called the Dr on call for her practice and she was told to come in and be evaluated.   She denied cramping, bleeding or LOF.

## 2017-12-25 NOTE — MAU Provider Note (Signed)
Chief Complaint:  Fall   First Provider Initiated Contact with Patient 12/25/17 2323      HPI: Hannah Floyd is a 32 y.o. [id number] at [id number] presents to maternity admissions reporting Fall around 2015 at son's basketball game. She reports that she did not fall on stomach and caught herself as she was falling. Fell on hands and knees. She denies abdominal pain, cramping or contractions.  She reports good fetal movement, denies LOF, vaginal bleeding, vaginal itching/burning, urinary symptoms, h/a, dizziness, n/v, or fever/chills.    Past Medical History: Past Medical History:  Diagnosis Date  . Folliculitis   . Hypothyroidism   . Vaginal Pap smear, abnormal     Past obstetric history: OB History  Gravida Para Term Preterm AB Living  4 2 2   1 2   SAB TAB Ectopic Multiple Live Births  1       2    # Outcome Date GA Lbr Len/2nd Weight Sex Delivery Anes PTL Lv  4 Current           3 Term 10/12/11        LIV  2 Term 12/02/08        LIV  1 SAB               Past Surgical History: Past Surgical History:  Procedure Laterality Date  . CESAREAN SECTION    . LAPAROSCOPIC APPENDECTOMY N/A 01/30/2016   Procedure: APPENDECTOMY LAPAROSCOPIC;  Surgeon: Avel Peaceodd Rosenbower, MD;  Location: [facility];  Service: General;  Laterality: N/A;  . LEEP      Family History: Family History  Problem Relation Age of Onset  . Hypertension Mother   . Hyperlipidemia Mother   . COPD Mother     Social History: Social History   Tobacco Use  . Smoking status: Never Smoker  . Smokeless tobacco: Never Used  Substance Use Topics  . Alcohol use: No  . Drug use: No    Allergies: No Known Allergies  Meds:  Medications Prior to Admission  Medication Sig Dispense Refill Last Dose  . levothyroxine (SYNTHROID) 75 MCG tablet Take 75 mcg by mouth daily before breakfast.    12/25/2017 at Unknown time  . Prenatal Vit w/Fe-Methylfol-FA (PNV PO) Take 1 tablet by mouth daily.    12/25/2017 at Unknown time  .  progesterone (PROMETRIUM) 200 MG capsule Place 200 mg daily vaginally.   12/24/2017 at Unknown time    ROS:  Review of Systems  Constitutional: Negative.   Respiratory: Negative.   Cardiovascular: Negative.   Gastrointestinal: Negative.   Genitourinary: Negative.   Musculoskeletal: Negative.   Neurological: Negative.   Psychiatric/Behavioral: Negative.    I have reviewed patient's Past Medical Hx, Surgical Hx, Family Hx, Social Hx, medications and allergies.   Physical Exam   Patient Vitals for the past 24 hrs:  BP Temp Temp src Pulse Resp SpO2 Height Weight  12/26/17 0303 136/86 - - 72 16 - - -  12/25/17 2300 133/78 98.3 F (36.8 C) Oral 78 16 97 % 5\' 1"  (1.549 m) 184 lb (83.5 kg)   Constitutional: Well-developed, well-nourished female in no acute distress.  Cardiovascular: normal rate Respiratory: normal effort GI: Abd soft, non-tender, gravid appropriate for gestational age.  MS: Extremities nontender, no edema, normal ROM Neurologic: Alert and oriented x 4.  GU: Neg CVAT.  Cervical exam: Deferred due to placenta previa and short cervix     FHT:  Baseline 125 , moderate variability, accelerations present, no decelerations  Contractions: occasional mild contractions during first 2 hours of monitoring, no contractions noted during last hours of monitoring    MAU Course/MDM:  4 hours fetal monitoring s/p Fall  NST reviewed Consult Dr Billy Coast with presentation and NST. Dr Billy Coast recommends patient staying for 4 hr if contracting more than 5 times in a hour if not contracting can monitor for 2 hours with appointment in the office next week.   Pt discharge with preventing injuries during pregnancy and fall precautions.  Assessment: 1. Fall, initial encounter     Plan: Discharge home Labor precautions and fetal kick counts Follow up as scheduled in the office and return to MAU as needed for emergencies    Allergies as of 12/26/2017   No Known Allergies      Medication List    TAKE these medications   PNV PO Take 1 tablet by mouth daily.   PROMETRIUM 200 MG capsule Generic drug:  progesterone Place 200 mg daily vaginally.   SYNTHROID 75 MCG tablet Generic drug:  levothyroxine Take 75 mcg by mouth daily before breakfast.       Steward Drone Certified Nurse-Midwife 12/25/2017 11:34 PM

## 2017-12-26 DIAGNOSIS — O99283 Endocrine, nutritional and metabolic diseases complicating pregnancy, third trimester: Secondary | ICD-10-CM | POA: Diagnosis not present

## 2017-12-26 DIAGNOSIS — Z3A35 35 weeks gestation of pregnancy: Secondary | ICD-10-CM | POA: Diagnosis not present

## 2017-12-26 DIAGNOSIS — T1490XA Injury, unspecified, initial encounter: Secondary | ICD-10-CM

## 2017-12-26 DIAGNOSIS — W19XXXA Unspecified fall, initial encounter: Secondary | ICD-10-CM

## 2017-12-26 DIAGNOSIS — E039 Hypothyroidism, unspecified: Secondary | ICD-10-CM | POA: Diagnosis not present

## 2017-12-26 DIAGNOSIS — O9A213 Injury, poisoning and certain other consequences of external causes complicating pregnancy, third trimester: Secondary | ICD-10-CM | POA: Diagnosis not present

## 2017-12-26 DIAGNOSIS — Z043 Encounter for examination and observation following other accident: Secondary | ICD-10-CM | POA: Diagnosis not present

## 2017-12-26 NOTE — Discharge Instructions (Signed)
Preventing Injuries During Pregnancy Injuries can happen during pregnancy. Minor falls and accidents usually do not harm you or your baby. But some injuries can harm you and your baby. Tell your doctor about any injury you suffer. What can I do to avoid injuries? Safety  Remove rugs and loose objects on the floor.  Wear comfortable shoes that have a good grip. Do not wear shoes that have high heels.  Always wear your seat belt in the car. The lap belt should be below your belly. Always drive safely.  Do not ride on a motorcycle. Activity  Do not take part in rough and violent activities or sports.  Avoid: ? Walking on wet or slippery floors. ? Lifting heavy pots of boiling or hot liquids. ? Fixing electrical problems. ? Being near fires. General instructions  Take over-the-counter and prescription medicines only as told by your doctor.  Know your blood type and the blood type of the baby's father.  If you are a victim of domestic violence: ? Call your local emergency services (911 in the [place] ? Contact the National Domestic Violence Hotline for help and support. Get help right away if:  You fall on your belly or receive any serious blow to your belly.  You have a stiff neck or neck pain after a fall or an injury.  You get a headache or have problems with vision after an injury.  You do not feel the baby move or the baby is not moving as much as normal.  You have been a victim of domestic violence or any other kind of attack.  You have been in a car accident.  You have bleeding from your vagina.  Fluid is leaking from your vagina.  You start to have cramping or pain in your belly (contractions).  You have very bad pain in your lower back.  You feel weak or pass out (faint).  You start to throw up (vomit) after an injury.  You have been burned. Summary  Some injuries that happen during pregnancy can do harm to the baby.  Tell your doctor about any  injury.  Take steps to avoid injury. This includes removing rugs and loose objects on the floor. Always wear your seat belt in the car.  Do not take part in rough and violent activities or sports.  Get help right away if you have any serious accident or injury. This information is not intended to replace advice given to you by your health care provider. Make sure you discuss any questions you have with your health care provider. Document Released: 01/12/2011 Document Revised: 12/19/2016 Document Reviewed: 12/19/2016 Elsevier Interactive Patient Education  2017 [employer]  

## 2017-12-27 ENCOUNTER — Encounter (HOSPITAL_COMMUNITY): Payer: Self-pay

## 2017-12-27 DIAGNOSIS — O365930 Maternal care for other known or suspected poor fetal growth, third trimester, not applicable or unspecified: Secondary | ICD-10-CM | POA: Diagnosis not present

## 2017-12-27 DIAGNOSIS — O4403 Placenta previa specified as without hemorrhage, third trimester: Secondary | ICD-10-CM | POA: Diagnosis not present

## 2017-12-27 DIAGNOSIS — Z3A35 35 weeks gestation of pregnancy: Secondary | ICD-10-CM | POA: Diagnosis not present

## 2017-12-27 DIAGNOSIS — O3433 Maternal care for cervical incompetence, third trimester: Secondary | ICD-10-CM | POA: Diagnosis not present

## 2017-12-30 DIAGNOSIS — Z3A36 36 weeks gestation of pregnancy: Secondary | ICD-10-CM | POA: Diagnosis not present

## 2017-12-30 DIAGNOSIS — O365930 Maternal care for other known or suspected poor fetal growth, third trimester, not applicable or unspecified: Secondary | ICD-10-CM | POA: Diagnosis not present

## 2017-12-30 NOTE — Patient Instructions (Signed)
Hannah Floyd  12/30/2017   Your procedure is scheduled on:  01/01/2018  [facility] at [facility] AM.  Pick up the phone at the desk and dial [phone]  Call this number if you have problems the morning of surgery:[phone]  Remember:   Do not eat food:After Midnight.  Do not drink clear liquids: After Midnight.  Take these medicines the morning of surgery with A SIP OF WATER: take your synthroid   Do not wear jewelry, make-up or nail polish.  Do not wear lotions, powders, or perfumes. Do not wear deodorant.  Do not shave 48 hours prior to surgery.  Do not bring valuables to the hospital.  [facility] is not   responsible for any belongings or valuables brought to the hospital.  Contacts, dentures or bridgework may not be worn into surgery.  Leave suitcase in the car. After surgery it may be brought to your room.  For patients admitted to the hospital, checkout time is 11:00 AM the day of              discharge.    N/A   Please read over the following fact sheets that you were given:   Surgical Site Infection Prevention

## 2017-12-31 ENCOUNTER — Encounter (HOSPITAL_COMMUNITY)
Admission: RE | Admit: 2017-12-31 | Discharge: 2017-12-31 | Disposition: A | Discharge status: Home / Self Care | Payer: 59 | Source: Ambulatory Visit | Attending: Obstetrics and Gynecology | Admitting: Obstetrics and Gynecology

## 2017-12-31 DIAGNOSIS — E039 Hypothyroidism, unspecified: Secondary | ICD-10-CM | POA: Diagnosis not present

## 2017-12-31 DIAGNOSIS — O365930 Maternal care for other known or suspected poor fetal growth, third trimester, not applicable or unspecified: Secondary | ICD-10-CM | POA: Diagnosis not present

## 2017-12-31 DIAGNOSIS — O9081 Anemia of the puerperium: Secondary | ICD-10-CM | POA: Diagnosis not present

## 2017-12-31 DIAGNOSIS — O99284 Endocrine, nutritional and metabolic diseases complicating childbirth: Secondary | ICD-10-CM | POA: Diagnosis not present

## 2017-12-31 DIAGNOSIS — O99214 Obesity complicating childbirth: Secondary | ICD-10-CM | POA: Diagnosis not present

## 2017-12-31 DIAGNOSIS — D62 Acute posthemorrhagic anemia: Secondary | ICD-10-CM | POA: Diagnosis not present

## 2017-12-31 DIAGNOSIS — O4403 Placenta previa specified as without hemorrhage, third trimester: Secondary | ICD-10-CM | POA: Diagnosis not present

## 2017-12-31 DIAGNOSIS — O34211 Maternal care for low transverse scar from previous cesarean delivery: Secondary | ICD-10-CM | POA: Diagnosis not present

## 2017-12-31 HISTORY — DX: Follicular disorder, unspecified: L73.9

## 2017-12-31 HISTORY — DX: Unspecified abnormal cytological findings in specimens from vagina: R87.629

## 2017-12-31 LAB — CBC
HCT: 34.9 % — ABNORMAL LOW (ref 36.0–46.0)
HEMOGLOBIN: 11.8 g/dL — AB (ref 12.0–15.0)
MCH: 28.5 pg (ref 26.0–34.0)
MCHC: 33.8 g/dL (ref 30.0–36.0)
MCV: 84.3 fL (ref 78.0–100.0)
Platelets: 271 10*3/uL (ref 150–400)
RBC: 4.14 MIL/uL (ref 3.87–5.11)
RDW: 14.4 % (ref 11.5–15.5)
WBC: 15.7 10*3/uL — ABNORMAL HIGH (ref 4.0–10.5)

## 2017-12-31 LAB — ABO/RH: ABO/RH(D): A POS

## 2018-01-01 ENCOUNTER — Encounter (HOSPITAL_COMMUNITY): Payer: Self-pay | Admitting: Certified Registered Nurse Anesthetist

## 2018-01-01 ENCOUNTER — Inpatient Hospital Stay (HOSPITAL_COMMUNITY): Payer: 59 | Admitting: Anesthesiology

## 2018-01-01 ENCOUNTER — Inpatient Hospital Stay (HOSPITAL_COMMUNITY)
Admission: RE | Admit: 2018-01-01 | Discharge: 2018-01-04 | DRG: 786 | Disposition: A | Discharge status: Home / Self Care | Payer: 59 | Source: Ambulatory Visit | Attending: Obstetrics and Gynecology | Admitting: Obstetrics and Gynecology

## 2018-01-01 ENCOUNTER — Other Ambulatory Visit: Payer: Self-pay

## 2018-01-01 ENCOUNTER — Encounter (HOSPITAL_COMMUNITY)
Admission: RE | Disposition: A | Discharge status: Home / Self Care | Payer: Self-pay | Source: Ambulatory Visit | Attending: Obstetrics and Gynecology

## 2018-01-01 DIAGNOSIS — O34211 Maternal care for low transverse scar from previous cesarean delivery: Secondary | ICD-10-CM | POA: Diagnosis present

## 2018-01-01 DIAGNOSIS — O9081 Anemia of the puerperium: Secondary | ICD-10-CM | POA: Diagnosis not present

## 2018-01-01 DIAGNOSIS — O4400 Placenta previa specified as without hemorrhage, unspecified trimester: Secondary | ICD-10-CM | POA: Diagnosis present

## 2018-01-01 DIAGNOSIS — O99214 Obesity complicating childbirth: Secondary | ICD-10-CM | POA: Diagnosis present

## 2018-01-01 DIAGNOSIS — O34219 Maternal care for unspecified type scar from previous cesarean delivery: Secondary | ICD-10-CM | POA: Diagnosis present

## 2018-01-01 DIAGNOSIS — E669 Obesity, unspecified: Secondary | ICD-10-CM | POA: Diagnosis present

## 2018-01-01 DIAGNOSIS — E039 Hypothyroidism, unspecified: Secondary | ICD-10-CM | POA: Diagnosis present

## 2018-01-01 DIAGNOSIS — O365930 Maternal care for other known or suspected poor fetal growth, third trimester, not applicable or unspecified: Secondary | ICD-10-CM | POA: Diagnosis not present

## 2018-01-01 DIAGNOSIS — Z3A36 36 weeks gestation of pregnancy: Secondary | ICD-10-CM | POA: Diagnosis not present

## 2018-01-01 DIAGNOSIS — D62 Acute posthemorrhagic anemia: Secondary | ICD-10-CM | POA: Diagnosis not present

## 2018-01-01 DIAGNOSIS — O4403 Placenta previa specified as without hemorrhage, third trimester: Secondary | ICD-10-CM | POA: Diagnosis not present

## 2018-01-01 DIAGNOSIS — O99284 Endocrine, nutritional and metabolic diseases complicating childbirth: Secondary | ICD-10-CM | POA: Diagnosis present

## 2018-01-01 DIAGNOSIS — O2293 Venous complication in pregnancy, unspecified, third trimester: Secondary | ICD-10-CM | POA: Diagnosis not present

## 2018-01-01 HISTORY — DX: Acute posthemorrhagic anemia: D62

## 2018-01-01 LAB — RPR: RPR: NONREACTIVE

## 2018-01-01 LAB — PREPARE RBC (CROSSMATCH)

## 2018-01-01 SURGERY — Surgical Case
Anesthesia: Spinal

## 2018-01-01 MED ORDER — LACTATED RINGERS IV SOLN
INTRAVENOUS | Status: DC
  Administered 2018-01-01: 22:00:00 via INTRAVENOUS

## 2018-01-01 MED ORDER — CEFAZOLIN SODIUM-DEXTROSE 2-4 GM/100ML-% IV SOLN
2.0000 g | INTRAVENOUS | Status: AC
  Administered 2018-01-01: 2 g via INTRAVENOUS
  Filled 2018-01-01: qty 100

## 2018-01-01 MED ORDER — OXYTOCIN 10 UNIT/ML IJ SOLN
INTRAMUSCULAR | Status: AC
  Filled 2018-01-01: qty 1

## 2018-01-01 MED ORDER — VASOPRESSIN 20 UNIT/ML IV SOLN
INTRAVENOUS | Status: AC
  Filled 2018-01-01: qty 1

## 2018-01-01 MED ORDER — LEVOTHYROXINE SODIUM 75 MCG PO TABS
75.0000 ug | ORAL_TABLET | Freq: Every day | ORAL | Status: DC
  Administered 2018-01-02 – 2018-01-04 (×3): 75 ug via ORAL
  Filled 2018-01-01 (×4): qty 1

## 2018-01-01 MED ORDER — LACTATED RINGERS IV SOLN
INTRAVENOUS | Status: DC
  Administered 2018-01-01 (×3): via INTRAVENOUS

## 2018-01-01 MED ORDER — NALBUPHINE HCL 10 MG/ML IJ SOLN: 5.0000 mg | Freq: Once | INTRAMUSCULAR | Status: DC | PRN

## 2018-01-01 MED ORDER — DIBUCAINE 1 % RE OINT: 1.0000 "application " | TOPICAL_OINTMENT | RECTAL | Status: DC | PRN

## 2018-01-01 MED ORDER — BUPIVACAINE HCL (PF) 0.25 % IJ SOLN
INTRAMUSCULAR | Status: DC | PRN
  Administered 2018-01-01: 20 mL

## 2018-01-01 MED ORDER — OXYCODONE HCL 5 MG/5ML PO SOLN: 5.0000 mg | Freq: Once | ORAL | Status: DC | PRN

## 2018-01-01 MED ORDER — VASOPRESSIN 20 UNIT/ML IV SOLN
INTRAVENOUS | Status: DC | PRN
  Administered 2018-01-01: 5 [IU]

## 2018-01-01 MED ORDER — OXYTOCIN 10 UNIT/ML IJ SOLN
INTRAMUSCULAR | Status: AC
  Filled 2018-01-01: qty 4

## 2018-01-01 MED ORDER — ONDANSETRON HCL 4 MG/2ML IJ SOLN
INTRAMUSCULAR | Status: DC | PRN
  Administered 2018-01-01: 4 mg via INTRAVENOUS

## 2018-01-01 MED ORDER — DIPHENHYDRAMINE HCL 25 MG PO CAPS
25.0000 mg | ORAL_CAPSULE | Freq: Four times a day (QID) | ORAL | Status: DC | PRN
  Administered 2018-01-02 (×2): 25 mg via ORAL
  Filled 2018-01-01 (×2): qty 1

## 2018-01-01 MED ORDER — SCOPOLAMINE 1 MG/3DAYS TD PT72
1.0000 | MEDICATED_PATCH | Freq: Once | TRANSDERMAL | Status: AC
  Administered 2018-01-01: 1.5 mg via TRANSDERMAL
  Filled 2018-01-01: qty 1

## 2018-01-01 MED ORDER — SENNOSIDES-DOCUSATE SODIUM 8.6-50 MG PO TABS
2.0000 | ORAL_TABLET | ORAL | Status: DC
  Administered 2018-01-02 – 2018-01-03 (×3): 2 via ORAL
  Filled 2018-01-01 (×3): qty 2

## 2018-01-01 MED ORDER — ACETAMINOPHEN 500 MG PO TABS: 1000.0000 mg | ORAL_TABLET | Freq: Four times a day (QID) | ORAL | Status: DC

## 2018-01-01 MED ORDER — KETOROLAC TROMETHAMINE 30 MG/ML IJ SOLN
30.0000 mg | Freq: Four times a day (QID) | INTRAMUSCULAR | Status: DC | PRN
  Administered 2018-01-01: 30 mg via INTRAMUSCULAR

## 2018-01-01 MED ORDER — OXYCODONE-ACETAMINOPHEN 5-325 MG PO TABS: 1.0000 | ORAL_TABLET | ORAL | Status: DC | PRN

## 2018-01-01 MED ORDER — PRENATAL MULTIVITAMIN CH
1.0000 | ORAL_TABLET | Freq: Every day | ORAL | Status: DC
  Administered 2018-01-02 – 2018-01-04 (×3): 1 via ORAL
  Filled 2018-01-01 (×4): qty 1

## 2018-01-01 MED ORDER — NALBUPHINE HCL 10 MG/ML IJ SOLN: 5.0000 mg | INTRAMUSCULAR | Status: DC | PRN

## 2018-01-01 MED ORDER — WITCH HAZEL-GLYCERIN EX PADS: 1.0000 "application " | MEDICATED_PAD | CUTANEOUS | Status: DC | PRN

## 2018-01-01 MED ORDER — METHYLERGONOVINE MALEATE 0.2 MG/ML IJ SOLN
INTRAMUSCULAR | Status: DC | PRN
  Administered 2018-01-01: 0.2 mg via INTRAMUSCULAR

## 2018-01-01 MED ORDER — SIMETHICONE 80 MG PO CHEW
80.0000 mg | CHEWABLE_TABLET | Freq: Three times a day (TID) | ORAL | Status: DC
  Administered 2018-01-01 – 2018-01-04 (×9): 80 mg via ORAL
  Filled 2018-01-01 (×10): qty 1

## 2018-01-01 MED ORDER — DEXAMETHASONE SODIUM PHOSPHATE 4 MG/ML IJ SOLN
INTRAMUSCULAR | Status: AC
  Filled 2018-01-01: qty 1

## 2018-01-01 MED ORDER — DIPHENHYDRAMINE HCL 50 MG/ML IJ SOLN: 12.5000 mg | INTRAMUSCULAR | Status: DC | PRN

## 2018-01-01 MED ORDER — SOD CITRATE-CITRIC ACID 500-334 MG/5ML PO SOLN
ORAL | Status: AC
  Administered 2018-01-01: 30 mL
  Filled 2018-01-01: qty 15

## 2018-01-01 MED ORDER — BUPIVACAINE IN DEXTROSE 0.75-8.25 % IT SOLN
INTRATHECAL | Status: DC | PRN
  Administered 2018-01-01: 1.6 mg via INTRATHECAL

## 2018-01-01 MED ORDER — MENTHOL 3 MG MT LOZG: 1.0000 | LOZENGE | OROMUCOSAL | Status: DC | PRN

## 2018-01-01 MED ORDER — OXYCODONE-ACETAMINOPHEN 5-325 MG PO TABS
2.0000 | ORAL_TABLET | ORAL | Status: DC | PRN
  Filled 2018-01-01: qty 2

## 2018-01-01 MED ORDER — COCONUT OIL OIL
1.0000 "application " | TOPICAL_OIL | Status: DC | PRN
  Administered 2018-01-02: 1 via TOPICAL
  Filled 2018-01-01: qty 120

## 2018-01-01 MED ORDER — KETOROLAC TROMETHAMINE 30 MG/ML IJ SOLN
INTRAMUSCULAR | Status: AC
  Filled 2018-01-01: qty 1

## 2018-01-01 MED ORDER — TRANEXAMIC ACID 1000 MG/10ML IV SOLN
1000.0000 mg | INTRAVENOUS | Status: AC
  Administered 2018-01-01: 1000 mg via INTRAVENOUS
  Filled 2018-01-01: qty 10

## 2018-01-01 MED ORDER — NALBUPHINE HCL 10 MG/ML IJ SOLN
5.0000 mg | INTRAMUSCULAR | Status: DC | PRN
  Administered 2018-01-01: 5 mg via INTRAVENOUS

## 2018-01-01 MED ORDER — SODIUM CHLORIDE 0.9 % IJ SOLN
INTRAMUSCULAR | Status: AC
  Filled 2018-01-01: qty 30

## 2018-01-01 MED ORDER — MORPHINE SULFATE (PF) 0.5 MG/ML IJ SOLN
INTRAMUSCULAR | Status: DC | PRN
  Administered 2018-01-01: .2 mg via INTRATHECAL

## 2018-01-01 MED ORDER — OXYTOCIN 40 UNITS IN LACTATED RINGERS INFUSION - SIMPLE MED: 2.5000 [IU]/h | INTRAVENOUS | Status: AC

## 2018-01-01 MED ORDER — ONDANSETRON HCL 4 MG/2ML IJ SOLN: 4.0000 mg | Freq: Three times a day (TID) | INTRAMUSCULAR | Status: DC | PRN

## 2018-01-01 MED ORDER — ACETAMINOPHEN 325 MG PO TABS
650.0000 mg | ORAL_TABLET | ORAL | Status: DC | PRN
  Administered 2018-01-01: 650 mg via ORAL
  Filled 2018-01-01: qty 2

## 2018-01-01 MED ORDER — NALOXONE HCL 0.4 MG/ML IJ SOLN
1.0000 ug/kg/h | INTRAVENOUS | Status: DC | PRN
  Filled 2018-01-01: qty 5

## 2018-01-01 MED ORDER — SIMETHICONE 80 MG PO CHEW
80.0000 mg | CHEWABLE_TABLET | ORAL | Status: DC
  Administered 2018-01-02 – 2018-01-03 (×3): 80 mg via ORAL
  Filled 2018-01-01 (×3): qty 1

## 2018-01-01 MED ORDER — SODIUM CHLORIDE 0.9 % IR SOLN
Status: DC | PRN
  Administered 2018-01-01: 1

## 2018-01-01 MED ORDER — PROMETHAZINE HCL 25 MG/ML IJ SOLN: 6.2500 mg | INTRAMUSCULAR | Status: DC | PRN

## 2018-01-01 MED ORDER — FENTANYL CITRATE (PF) 100 MCG/2ML IJ SOLN
INTRAMUSCULAR | Status: AC
  Filled 2018-01-01: qty 2

## 2018-01-01 MED ORDER — MEPERIDINE HCL 25 MG/ML IJ SOLN: 6.2500 mg | INTRAMUSCULAR | Status: DC | PRN

## 2018-01-01 MED ORDER — METHYLERGONOVINE MALEATE 0.2 MG/ML IJ SOLN
INTRAMUSCULAR | Status: AC
  Filled 2018-01-01: qty 1

## 2018-01-01 MED ORDER — SODIUM CHLORIDE 0.9% FLUSH: 3.0000 mL | INTRAVENOUS | Status: DC | PRN

## 2018-01-01 MED ORDER — METHYLERGONOVINE MALEATE 0.2 MG/ML IJ SOLN: 0.2000 mg | INTRAMUSCULAR | Status: DC | PRN

## 2018-01-01 MED ORDER — TETANUS-DIPHTH-ACELL PERTUSSIS 5-2.5-18.5 LF-MCG/0.5 IM SUSP: 0.5000 mL | Freq: Once | INTRAMUSCULAR | Status: DC

## 2018-01-01 MED ORDER — DIPHENHYDRAMINE HCL 25 MG PO CAPS
25.0000 mg | ORAL_CAPSULE | ORAL | Status: DC | PRN
  Filled 2018-01-01: qty 1

## 2018-01-01 MED ORDER — OXYTOCIN 10 UNIT/ML IJ SOLN
INTRAVENOUS | Status: DC | PRN
  Administered 2018-01-01: 40 [IU] via INTRAVENOUS

## 2018-01-01 MED ORDER — NALBUPHINE HCL 10 MG/ML IJ SOLN
INTRAMUSCULAR | Status: AC
  Filled 2018-01-01: qty 1

## 2018-01-01 MED ORDER — ZOLPIDEM TARTRATE 5 MG PO TABS: 5.0000 mg | ORAL_TABLET | Freq: Every evening | ORAL | Status: DC | PRN

## 2018-01-01 MED ORDER — KETOROLAC TROMETHAMINE 30 MG/ML IJ SOLN: 30.0000 mg | Freq: Four times a day (QID) | INTRAMUSCULAR | Status: DC | PRN

## 2018-01-01 MED ORDER — SIMETHICONE 80 MG PO CHEW: 80.0000 mg | CHEWABLE_TABLET | ORAL | Status: DC | PRN

## 2018-01-01 MED ORDER — ONDANSETRON HCL 4 MG/2ML IJ SOLN
INTRAMUSCULAR | Status: AC
  Filled 2018-01-01: qty 2

## 2018-01-01 MED ORDER — MORPHINE SULFATE (PF) 0.5 MG/ML IJ SOLN
INTRAMUSCULAR | Status: AC
  Filled 2018-01-01: qty 10

## 2018-01-01 MED ORDER — METHYLERGONOVINE MALEATE 0.2 MG PO TABS: 0.2000 mg | ORAL_TABLET | ORAL | Status: DC | PRN

## 2018-01-01 MED ORDER — FENTANYL CITRATE (PF) 100 MCG/2ML IJ SOLN: 25.0000 ug | INTRAMUSCULAR | Status: DC | PRN

## 2018-01-01 MED ORDER — NALOXONE HCL 0.4 MG/ML IJ SOLN: 0.4000 mg | INTRAMUSCULAR | Status: DC | PRN

## 2018-01-01 MED ORDER — IBUPROFEN 600 MG PO TABS
600.0000 mg | ORAL_TABLET | Freq: Four times a day (QID) | ORAL | Status: DC
  Administered 2018-01-02 – 2018-01-04 (×11): 600 mg via ORAL
  Filled 2018-01-01 (×13): qty 1

## 2018-01-01 MED ORDER — FENTANYL CITRATE (PF) 100 MCG/2ML IJ SOLN
INTRAMUSCULAR | Status: DC | PRN
  Administered 2018-01-01: 10 ug via INTRATHECAL

## 2018-01-01 MED ORDER — BUPIVACAINE HCL (PF) 0.25 % IJ SOLN
INTRAMUSCULAR | Status: AC
  Filled 2018-01-01: qty 30

## 2018-01-01 MED ORDER — PHENYLEPHRINE 8 MG IN D5W 100 ML (0.08MG/ML) PREMIX OPTIME
INJECTION | INTRAVENOUS | Status: AC
  Filled 2018-01-01: qty 100

## 2018-01-01 MED ORDER — PHENYLEPHRINE 8 MG IN D5W 100 ML (0.08MG/ML) PREMIX OPTIME
INJECTION | INTRAVENOUS | Status: DC | PRN
  Administered 2018-01-01: 30 ug/min via INTRAVENOUS

## 2018-01-01 MED ORDER — OXYCODONE HCL 5 MG PO TABS: 5.0000 mg | ORAL_TABLET | Freq: Once | ORAL | Status: DC | PRN

## 2018-01-01 SURGICAL SUPPLY — 36 items
CHLORAPREP W/TINT 26ML (MISCELLANEOUS) ×3 IMPLANT
CLAMP CORD UMBIL (MISCELLANEOUS) IMPLANT
CLOTH BEACON ORANGE TIMEOUT ST (SAFETY) ×3 IMPLANT
CONTAINER PREFILL 10% NBF 15ML (MISCELLANEOUS) IMPLANT
DERMABOND ADHESIVE PROPEN (GAUZE/BANDAGES/DRESSINGS) ×2
DERMABOND ADVANCED .7 DNX6 (GAUZE/BANDAGES/DRESSINGS) ×1 IMPLANT
DRSG OPSITE POSTOP 4X10 (GAUZE/BANDAGES/DRESSINGS) ×3 IMPLANT
ELECT REM PT RETURN 9FT ADLT (ELECTROSURGICAL) ×3
ELECTRODE REM PT RTRN 9FT ADLT (ELECTROSURGICAL) ×1 IMPLANT
EXTRACTOR VACUUM M CUP 4 TUBE (SUCTIONS) IMPLANT
EXTRACTOR VACUUM M CUP 4' TUBE (SUCTIONS)
GLOVE BIO SURGEON STRL SZ7.5 (GLOVE) ×3 IMPLANT
GLOVE BIOGEL PI IND STRL 7.0 (GLOVE) ×1 IMPLANT
GLOVE BIOGEL PI INDICATOR 7.0 (GLOVE) ×2
GOWN STRL REUS W/TWL LRG LVL3 (GOWN DISPOSABLE) ×6 IMPLANT
KIT ABG SYR 3ML LUER SLIP (SYRINGE) IMPLANT
NEEDLE HYPO 22GX1.5 SAFETY (NEEDLE) ×3 IMPLANT
NEEDLE HYPO 25X5/8 SAFETYGLIDE (NEEDLE) IMPLANT
NEEDLE SPNL 20GX3.5 QUINCKE YW (NEEDLE) IMPLANT
NS IRRIG 1000ML POUR BTL (IV SOLUTION) ×3 IMPLANT
PACK C SECTION WH (CUSTOM PROCEDURE TRAY) ×3 IMPLANT
PENCIL SMOKE EVAC W/HOLSTER (ELECTROSURGICAL) ×3 IMPLANT
SUT CHROMIC 0 CTX 36 (SUTURE) ×3 IMPLANT
SUT MNCRL 0 VIOLET CTX 36 (SUTURE) ×2 IMPLANT
SUT MNCRL AB 3-0 PS2 27 (SUTURE) IMPLANT
SUT MON AB 2-0 CT1 27 (SUTURE) ×3 IMPLANT
SUT MON AB-0 CT1 36 (SUTURE) ×9 IMPLANT
SUT MONOCRYL 0 CTX 36 (SUTURE) ×4
SUT PLAIN 0 NONE (SUTURE) IMPLANT
SUT PLAIN 2 0 (SUTURE)
SUT PLAIN 2 0 XLH (SUTURE) IMPLANT
SUT PLAIN ABS 2-0 CT1 27XMFL (SUTURE) IMPLANT
SYR 20CC LL (SYRINGE) IMPLANT
SYR CONTROL 10ML LL (SYRINGE) ×3 IMPLANT
TOWEL OR 17X24 6PK STRL BLUE (TOWEL DISPOSABLE) ×3 IMPLANT
TRAY FOLEY BAG SILVER LF 14FR (SET/KITS/TRAYS/PACK) ×3 IMPLANT

## 2018-01-01 NOTE — Transfer of Care (Signed)
Immediate Anesthesia Transfer of Care Note  Patient: Hannah Floyd  Procedure(s) Performed: Repeat CESAREAN SECTION (N/A )  Patient Location: PACU  Anesthesia Type:Spinal  Level of Consciousness: awake  Airway & Oxygen Therapy: Patient Spontanous Breathing  Post-op Assessment: Report given to RN  Post vital signs: Reviewed and stable  Last Vitals:  Vitals:   01/01/18 1133  BP: 123/78  Pulse: 64  Resp: 18  Temp: 36.6 C    Last Pain:  Vitals:   01/01/18 1133  TempSrc: Oral  PainSc: 0-No pain         Complications: No apparent anesthesia complications

## 2018-01-01 NOTE — Anesthesia Preprocedure Evaluation (Addendum)
Anesthesia Evaluation  Patient identified by MRN, date of birth, ID band Patient awake    Reviewed: Allergy & Precautions, NPO status , Patient's Chart, lab work & pertinent test results  Airway Mallampati: II  TM Distance: >3 FB Neck ROM: Full    Dental  (+) Teeth Intact, Dental Advisory Given   Pulmonary neg pulmonary ROS,    breath sounds clear to auscultation       Cardiovascular negative cardio ROS   Rhythm:Regular Rate:Normal     Neuro/Psych negative neurological ROS  negative psych ROS   GI/Hepatic negative GI ROS, Neg liver ROS,   Endo/Other  Hypothyroidism Obesity  Renal/GU negative Renal ROS  negative genitourinary   Musculoskeletal negative musculoskeletal ROS (+)   Abdominal   Peds  Hematology  (+) anemia ,   Anesthesia Other Findings   Reproductive/Obstetrics (+) Pregnancy Placenta previa                             Anesthesia Physical  Anesthesia Plan  ASA: II and emergent  Anesthesia Plan: Spinal   Post-op Pain Management:    Induction:   PONV Risk Score and Plan: Treatment may vary due to age or medical condition  Airway Management Planned: Natural Airway and Nasal Cannula  Additional Equipment: None  Intra-op Plan:   Post-operative Plan:   Informed Consent: I have reviewed the patients History and Physical, chart, labs and discussed the procedure including the risks, benefits and alternatives for the proposed anesthesia with the patient or authorized representative who has indicated his/her understanding and acceptance.   Dental advisory given  Plan Discussed with: CRNA  Anesthesia Plan Comments:         Anesthesia Quick Evaluation

## 2018-01-01 NOTE — Op Note (Signed)
Cesarean Section Procedure Note  Indications: placenta previa, previous uterine incision kerr x2 and IUGR  Pre-operative Diagnosis: 36 week 2 day pregnancy.  Post-operative Diagnosis: same  Surgeon: Lenoard AdenAAVON,Amdrew Oboyle J   Assistants: Juliene PinaMody, MD  Anesthesia: Local anesthesia 0.25.% bupivacaine and Spinal anesthesia  ASA Class: 2  Procedure Details  The patient was seen in the Holding Room. The risks, benefits, complications, treatment options, and expected outcomes were discussed with the patient.  The patient concurred with the proposed plan, giving informed consent. The risks of anesthesia, infection, bleeding and possible injury to other organs discussed. Injury to bowel, bladder, or ureter with possible need for repair discussed. Possible need for transfusion with secondary risks of hepatitis or HIV acquisition discussed. Post operative complications to include but not limited to DVT, PE and Pneumonia noted. The site of surgery properly noted/marked. The patient was taken to Operating Room # 1, identified as Hannah Floyd and the procedure verified as C-Section Delivery. A Time Out was held and the above information confirmed.  After induction of anesthesia, the patient was draped and prepped in the usual sterile manner. A Pfannenstiel incision was made and carried down through the subcutaneous tissue to the fascia. Fascial incision was made and extended transversely using Mayo scissors. The fascia was separated from the underlying rectus tissue superiorly and inferiorly. The peritoneum was identified and entered. Peritoneal incision was extended longitudinally. The utero-vesical peritoneal reflection was incised transversely and the bladder flap was bluntly freed from the lower uterine segment. LUS window on left side noted.A low transverse uterine incision(Kerr hysterotomy) was made. Delivered from OT presentation was a  female with Apgar scores of 8 at one minute and 9 at five minutes. Vacuum  assistance x  One,.Bulb suctioning gently performed. Neonatal team in attendance.After the umbilical cord was clamped and cut cord blood was obtained for evaluation. The placenta was removed intact and appeared normal. Confirmed posterior previa.The uterus was curetted with a dry lap pack. Good hemostasis was noted.The uterine outline, tubes and ovaries appeared normal. Placental bed infiltrated with dilute pitressin solution and one hemostatic square suture with 1-0 chromic placed. The uterine incision was closed with running locked sutures of 0 Monocryl x 2 layers. Hemostasis was observed. Lavage was carried out until clear.The parietal peritoneum was closed with a running 2-0 Monocryl suture. Deep suture in left rectus sheath for hemostasis. The fascia was then reapproximated with running sutures of 0 Monocryl. The skin was reapproximated with 3-0 monocryl after [place] closure with 2-0 plain.  Instrument, sponge, and needle counts were correct prior the abdominal closure and at the conclusion of the case.   Findings: 1850gm female, vacuum assist, Post previa, LUS window, nl adnexa  Estimated Blood Loss:  700         Drains: foley                 Specimens: placenta to path                 Complications:  None; patient tolerated the procedure well.         Disposition: PACU - hemodynamically stable.         Condition: stable  Attending Attestation: I performed the procedure.

## 2018-01-01 NOTE — Progress Notes (Signed)
Patient ID: Hannah Floyd, female   DOB: 10-24-1986, 32 y.o.   MRN: [id number] Patient seen and examined. Consent witnessed and signed. No changes noted. Update completed. BP 123/78   Pulse 64   Temp 97.8 F (36.6 C) (Oral)   Resp 18   Ht 5\' 1"  (1.549 m)   Wt 83.6 kg (184 lb 3.2 oz)   LMP 04/10/2017   BMI 34.80 kg/m   CBC    Component Value Date/Time   WBC 15.7 (H) 12/31/2017 0950   RBC 4.14 12/31/2017 0950   HGB 11.8 (L) 12/31/2017 0950   HGB 13.5 04/23/2017 1032   HCT 34.9 (L) 12/31/2017 0950   HCT 41.3 04/23/2017 1032   PLT 271 12/31/2017 0950   PLT 320 04/23/2017 1032   MCV 84.3 12/31/2017 0950   MCV 87 04/23/2017 1032   MCH 28.5 12/31/2017 0950   MCHC 33.8 12/31/2017 0950   RDW 14.4 12/31/2017 0950   RDW 13.2 04/23/2017 1032   LYMPHSABS 2.1 04/23/2017 1032   MONOABS 1.0 01/30/2016 1735   EOSABS 0.1 04/23/2017 1032   BASOSABS 0.0 04/23/2017 1032

## 2018-01-01 NOTE — Anesthesia Procedure Notes (Signed)
Spinal  Patient location during procedure: OR Start time: 01/01/2018 12:52 PM End time: 01/01/2018 12:56 PM Staffing Anesthesiologist: Beryle LatheBrock, Latonyia Lopata E, MD Performed: anesthesiologist  Preanesthetic Checklist Completed: patient identified, surgical consent, pre-op evaluation, timeout performed, IV checked, risks and benefits discussed and monitors and equipment checked Spinal Block Patient position: sitting Prep: DuraPrep Patient monitoring: heart rate, cardiac monitor, continuous pulse ox and blood pressure Approach: midline Location: L3-4 Injection technique: single-shot Needle Needle type: Pencan  Needle gauge: 24 G Additional Notes Functioning IV was confirmed and monitors were applied. Sterile prep and drape, including hand hygiene, mask, and sterile gloves were used. The patient was positioned and the spine was prepped. The skin was anesthetized with lidocaine. Free flow of clear CSF was obtained prior to injecting local anesthetic into the CSF. The spinal needle aspirated freely following injection. The needle was carefully withdrawn. The patient tolerated the procedure well. Consent was obtained prior to the procedure with all questions answered and concerns addressed. Risks including, but not limited to, bleeding, infection, nerve damage, paralysis, failed block, inadequate analgesia, allergic reaction, high spinal, itching, and headache were discussed and the patient wished to proceed.  Leslye Peerhomas Fenix Rorke, MD

## 2018-01-01 NOTE — H&P (Signed)
Hannah Floyd is a 32 y.o. female presenting for rpt csection in pregnancy complicated by IUGR and Placenta previa. OB History    Gravida Para Term Preterm AB Living   4 2 2   1 2    SAB TAB Ectopic Multiple Live Births   1       2     Past Medical History:  Diagnosis Date  . Folliculitis   . Hypothyroidism   . Vaginal Pap smear, abnormal    Past Surgical History:  Procedure Laterality Date  . APPENDECTOMY    . CESAREAN SECTION    . LAPAROSCOPIC APPENDECTOMY N/A 01/30/2016   Procedure: APPENDECTOMY LAPAROSCOPIC;  Surgeon: Avel Peaceodd Rosenbower, MD;  Location: [facility]S;  Service: General;  Laterality: N/A;  . LEEP     Family History: family history includes COPD in her mother; Hyperlipidemia in her mother; Hypertension in her mother; Hypothyroidism in her mother. Social History:  reports that  has never smoked. she has never used smokeless tobacco. She reports that she does not drink alcohol or use drugs.     Maternal Diabetes: No Genetic Screening: Normal Maternal Ultrasounds/Referrals: Normal Fetal Ultrasounds or other Referrals:  None- MFM sono done to rule out morbidly adherent placenta Maternal Substance Abuse:  No Significant Maternal Medications:  None Significant Maternal Lab Results:  None Other Comments:  None  Review of Systems  Constitutional: Negative.   All other systems reviewed and are negative.  Maternal Medical History:  Contractions: Onset was less than 1 hour ago.   Frequency: irregular.   Perceived severity is mild.    Fetal activity: Perceived fetal activity is normal.   Last perceived fetal movement was within the past hour.    Prenatal complications: IUGR and placental abnormality.   Prenatal Complications - Diabetes: none.      Last menstrual period 04/10/2017. Maternal Exam:  Uterine Assessment: Contraction strength is mild.  Contraction frequency is rare.   Abdomen: Patient reports no abdominal tenderness. Surgical scars: low transverse.    Fetal presentation: vertex  Introitus: Normal vulva. Normal vagina.  Ferning test: not done.  Nitrazine test: not done. Amniotic fluid character: not assessed.  Pelvis: of concern for delivery.   Cervix: Cervix evaluated by digital exam.     Physical Exam  Nursing note and vitals reviewed. Constitutional: She is oriented to person, place, and time. She appears well-developed and well-nourished.  HENT:  Head: Normocephalic and atraumatic.  Neck: Normal range of motion. Neck supple.  Cardiovascular: Normal rate and regular rhythm.  Respiratory: Effort normal.  GI: Soft. Bowel sounds are normal.  Genitourinary: Vagina normal and uterus normal.  Musculoskeletal: Normal range of motion.  Neurological: She is alert and oriented to person, place, and time. She has normal reflexes.  Skin: Skin is warm and dry.  Psychiatric: She has a normal mood and affect.    Prenatal labs: ABO, Rh: --/--/A POS, A POS (01/08 0950) Antibody: NEG (01/08 0950) Rubella: Immune (07/06 0000) RPR: Non Reactive (01/08 0950)  HBsAg: Negative (07/06 0000)  HIV: Non-reactive (07/06 0000)  GBS:     Assessment/Plan: 37 weeks IUGR- 0% percentile, elevated UAD , no AEDF Previous csection x 2 Previa- no evidence of MAP Rpt csection. Surgical risks noted. Bleeding risks discussed. Need for transfusion, possible need for hysterectomy discussed.    Dian Minahan J 01/01/2018, 6:29 AM

## 2018-01-01 NOTE — Anesthesia Postprocedure Evaluation (Signed)
Anesthesia Post Note  Patient: Hannah Floyd  Procedure(s) Performed: Repeat CESAREAN SECTION (N/A )     Patient location during evaluation: PACU Anesthesia Type: Spinal Level of consciousness: awake and alert Pain management: pain level controlled Vital Signs Assessment: post-procedure vital signs reviewed and stable Respiratory status: spontaneous breathing and respiratory function stable Cardiovascular status: blood pressure returned to baseline and stable Postop Assessment: spinal receding and no apparent nausea or vomiting Anesthetic complications: no    Last Vitals:  Vitals:   01/01/18 1500 01/01/18 1515  BP: 135/78 (!) 126/48  Pulse: 70 (!) 57  Resp: (!) 24 14  Temp:    SpO2: 99% 95%    Last Pain:  Vitals:   01/01/18 1430  TempSrc:   PainSc: 0-No pain   Pain Goal:                 Beryle Lathehomas E Brock

## 2018-01-02 ENCOUNTER — Encounter (HOSPITAL_COMMUNITY): Payer: Self-pay | Admitting: Obstetrics & Gynecology

## 2018-01-02 DIAGNOSIS — D62 Acute posthemorrhagic anemia: Secondary | ICD-10-CM

## 2018-01-02 HISTORY — DX: Acute posthemorrhagic anemia: D62

## 2018-01-02 LAB — CBC
HEMATOCRIT: 30.1 % — AB (ref 36.0–46.0)
HEMOGLOBIN: 10.1 g/dL — AB (ref 12.0–15.0)
MCH: 28.6 pg (ref 26.0–34.0)
MCHC: 33.6 g/dL (ref 30.0–36.0)
MCV: 85.3 fL (ref 78.0–100.0)
Platelets: 210 10*3/uL (ref 150–400)
RBC: 3.53 MIL/uL — AB (ref 3.87–5.11)
RDW: 14.8 % (ref 11.5–15.5)
WBC: 10.3 10*3/uL (ref 4.0–10.5)

## 2018-01-02 MED ORDER — NALBUPHINE HCL 10 MG/ML IJ SOLN
10.0000 mg | Freq: Once | INTRAMUSCULAR | Status: AC
  Administered 2018-01-02: 10 mg via INTRAVENOUS
  Filled 2018-01-02: qty 1

## 2018-01-02 MED ORDER — FLUTICASONE PROPIONATE 50 MCG/ACT NA SUSP
1.0000 | Freq: Two times a day (BID) | NASAL | Status: DC
  Administered 2018-01-02 – 2018-01-04 (×3): 1 via NASAL
  Filled 2018-01-02: qty 16

## 2018-01-02 NOTE — Progress Notes (Signed)
Subjective: Postpartum Day 1, Repeat Cesarean Delivery at 36.2 wks for placenta previa  Patient reports no complaints, ambulating, voided since foley was out this AM. Vaginal bleeding is now lighter. Pain well controlled, not requested any meds.  Pumping, plans to breast feed. NICU baby but stable   Objective: Vital signs in last 24 hours: Temp:  [97.5 F (36.4 C)-98.3 F (36.8 C)] 98.3 F (36.8 C) (01/10 0759) Pulse Rate:  [57-71] 71 (01/10 0759) Resp:  [14-24] 16 (01/10 0759) BP: (109-135)/(48-85) 129/70 (01/10 0759) SpO2:  [95 %-100 %] 99 % (01/10 0759) Weight:  [184 lb 3.2 oz (83.6 kg)] 184 lb 3.2 oz (83.6 kg) (01/09 1133)  Physical Exam:  General: alert, cooperative and appears stated age  Lungs CTA CV RRR, ejection systolic murmur possibly from anemia  Lochia: appropriate Uterine Fundus: firm Incision: Honeycomb dry and intact  DVT Evaluation: No evidence of DVT seen on physical exam.  Recent Labs    12/31/17 0950 01/02/18 0539  HGB 11.8* 10.1*  HCT 34.9* 30.1*   Rh positive Rub Immune  Assessment/Plan: Status post Cesarean section. Doing well postoperatively.  Continue current care. Not planning early discharge since NICU baby- SGA 4 lbs. Girl   Hannah Floyd 01/02/2018, 10:35 AM

## 2018-01-02 NOTE — Anesthesia Postprocedure Evaluation (Signed)
Anesthesia Post Note  Patient: Hannah Floyd  Procedure(s) Performed: Repeat CESAREAN SECTION (N/A )     Patient location during evaluation: [facility] Anesthesia Type: Spinal Level of consciousness: awake and alert and oriented Pain management: satisfactory to patient Vital Signs Assessment: post-procedure vital signs reviewed and stable Respiratory status: spontaneous breathing and respiratory function stable Cardiovascular status: stable Postop Assessment: adequate PO intake Anesthetic complications: no    Last Vitals:  Vitals:   01/02/18 0400 01/02/18 0759  BP: 130/66 129/70  Pulse: 68 71  Resp: 16 16  Temp: 36.5 C 36.8 C  SpO2: 99% 99%    Last Pain:  Vitals:   01/02/18 0800  TempSrc:   PainSc: 3    Pain Goal: Patients Stated Pain Goal: 3 (01/02/18 0800)               Karleen DolphinFUSSELL,Shayra Anton

## 2018-01-02 NOTE — Addendum Note (Signed)
Addendum  created 01/02/18 0932 by Graciela HusbandsFussell, Sofya Moustafa O, CRNA   Sign clinical note

## 2018-01-02 NOTE — Lactation Note (Signed)
This note was copied from a baby's chart. Lactation Consultation Note  Patient Name: Hannah Floyd Date: 01/02/2018 Reason for consult: Initial assessment;Late-preterm 34-36.6wks;Infant < 6lbs;NICU baby  Pecola LeisureBaby is 24 hours,  Per mom was set up with the DEBP last night around midnight and she has pumped with DEBP 4-5   times since . With max yield 10 ml. Also has tired latching in NICU.  LC praised mom for her consistent pumping.  LC reviewed supply and demand and the importance of 8 x's in 24 hours, STS , and working on the latching.  Mom mentioned with her 1st two she got really sore and stopped breast feeding before she left  The hospital and pumped. She also voiced concerns of flat nipples , LC assessed with her permission and noted to nipples flat, but the areolas were compressible. LC reviewed hand expressing with drops, and showed mom how compressible the areolas are. LC instructed mom on the use shells between feedings except when sleeping. LC noted small abrasion on the the right nipple , suggested the EBM liberally and the HROB , obtained coconut oil for her nipples to be used before pumping.  LC encouraged mom to hand express before and after pumping to enhance volume.  Per mom has [employer] insurance, [employer] employee, [facility] reviewed the process of [facility]  Mother informed of post-discharge support and given phone number to the lactation department, including services for phone call assistance; out-patient appointments; and breastfeeding support group. List of other breastfeeding resources in the community given in the handout. Encouraged mother to call for problems or concerns related to breastfeeding.   Maternal Data Has patient been taught Hand Expression?: Yes Does the patient have breastfeeding experience prior to this delivery?: Yes  Feeding Feeding Type: Donor Breast Milk Nipple Type: Slow - flow  LATCH Score                    Interventions Interventions: Breast feeding basics reviewed;DEBP  Lactation Tools Discussed/Used Tools: Pump Breast pump type: Double-Electric Breast Pump   Consult Status Consult Status: Follow-up Date: 01/03/18 Follow-up type: In-patient    Matilde SprangMargaret Ann Eliya Geiman 01/02/2018, 1:28 PM

## 2018-01-02 NOTE — Progress Notes (Signed)
Initial visit with Hannah Floyd at her bedside to introduce spiritual care services and offer support upon her early delivery.  She has two other children (1066 and 34 year old sons) but this is her first baby in the NICU.  She is coping well, but vocalized stress and concern related to Claire's small size and her eating.  Hannah Floyd's 4six year old misses his mother and is frustrated that he can't see his baby sister.  She reports that he feels like its his baby because he prayed for her.  Hannah Floyd is hopeful that she won't be discharged until Saturday and by then will feel much more confident that Hannah Floyd is making progress.  Will continue to follow.  Please page as further needs arise.  Hannah Floyd, M.Div. [facility] Chaplain Pager [phone] Office [phone]

## 2018-01-03 ENCOUNTER — Encounter (HOSPITAL_COMMUNITY): Payer: Self-pay | Admitting: Obstetrics and Gynecology

## 2018-01-03 NOTE — Lactation Note (Signed)
This note was copied from a baby's chart. Lactation Consultation Note  Patient Name: Hannah Georgina PeerJacquelyn Adee WUJWJ'XToday's Date: 01/03/2018   [employer] double electric breast pump given to Hannah Floyd.    Judee ClaraSmith, Marcello Tuzzolino E 01/03/2018, 5:15 PM

## 2018-01-03 NOTE — Progress Notes (Signed)
POD# 2, RCS for previa  S: Pt not in room, visitin baby in NICU  Vitals:   01/02/18 2236 01/02/18 2343 01/03/18 0430 01/03/18 0820  BP:    129/76  Pulse:    82  Resp: 15 17 18 18   Temp:    (!) 97.4 F (36.3 C)  TempSrc:    Oral  SpO2:    100%  Weight:      Height:        CBC    Component Value Date/Time   WBC 10.3 01/02/2018 0539   RBC 3.53 (L) 01/02/2018 0539   HGB 10.1 (L) 01/02/2018 0539   HGB 13.5 04/23/2017 1032   HCT 30.1 (L) 01/02/2018 0539   HCT 41.3 04/23/2017 1032   PLT 210 01/02/2018 0539   PLT 320 04/23/2017 1032   MCV 85.3 01/02/2018 0539   MCV 87 04/23/2017 1032   MCH 28.6 01/02/2018 0539   MCHC 33.6 01/02/2018 0539   RDW 14.8 01/02/2018 0539   RDW 13.2 04/23/2017 1032   LYMPHSABS 2.1 04/23/2017 1032   MONOABS 1.0 01/30/2016 1735   EOSABS 0.1 04/23/2017 1032   BASOSABS 0.0 04/23/2017 1032    A/P: POD#  2 s/p RCS - wil try to see pt later  Lendon ColonelKelly A Tashonda Pinkus 01/03/2018 9:07 AM

## 2018-01-03 NOTE — Lactation Note (Signed)
This note was copied from a baby's chart. Lactation Consultation Note  Patient Name: Hannah Floyd JYNWG'NToday's Date: 01/03/2018 Reason for consult: Follow-up assessment;NICU baby Called from NICU to assist with feeding.  Baby is 46 hours old and 36.4 weeks.  Mom states baby has latched once for 20 minutes.  Nipples semi flat. Mom can easily express colostrum.  She is pumping every 3 hours but not obtaining milk yet.  Discussed late preterm feeding norm and encouraged an outpatient appointment after discharge.   Baby sleepy at breast.  She holds nipple in her mouth with a few non nutritive sucks. A 20 mm nipple shield applied but no suck elicited.  Reasssured.  Encouraged to call for assist/concerns prn.  Maternal Data    Feeding Feeding Type: Breast Fed Nipple Type: Slow - flow Length of feed: 10 min  LATCH Score Latch: Too sleepy or reluctant, no latch achieved, no sucking elicited.  Audible Swallowing: None  Type of Nipple: Flat  Comfort (Breast/Nipple): Soft / non-tender  Hold (Positioning): Assistance needed to correctly position infant at breast and maintain latch.  LATCH Score: 4  Interventions    Lactation Tools Discussed/Used     Consult Status Consult Status: PRN    Huston FoleyMOULDEN, Jaylynne Birkhead S 01/03/2018, 11:37 AM

## 2018-01-04 DIAGNOSIS — O4400 Placenta previa specified as without hemorrhage, unspecified trimester: Secondary | ICD-10-CM | POA: Diagnosis present

## 2018-01-04 LAB — BPAM RBC
Blood Product Expiration Date: 201902072359
Blood Product Expiration Date: 201902072359
UNIT TYPE AND RH: 6200
Unit Type and Rh: 6200

## 2018-01-04 LAB — TYPE AND SCREEN
ABO/RH(D): A POS
ANTIBODY SCREEN: NEGATIVE
Unit division: 0
Unit division: 0

## 2018-01-04 MED ORDER — COCONUT OIL OIL: 1.0000 "application " | TOPICAL_OIL | 0 refills | Status: DC | PRN

## 2018-01-04 MED ORDER — SIMETHICONE 80 MG PO CHEW: 80.0000 mg | CHEWABLE_TABLET | Freq: Three times a day (TID) | ORAL | 0 refills | Status: DC

## 2018-01-04 MED ORDER — ACETAMINOPHEN 325 MG PO TABS: 650.0000 mg | ORAL_TABLET | ORAL | Status: DC | PRN

## 2018-01-04 MED ORDER — SENNOSIDES-DOCUSATE SODIUM 8.6-50 MG PO TABS: 2.0000 | ORAL_TABLET | ORAL | Status: DC

## 2018-01-04 MED ORDER — IBUPROFEN 600 MG PO TABS: 600.0000 mg | ORAL_TABLET | Freq: Four times a day (QID) | ORAL | 0 refills | Status: DC

## 2018-01-04 MED ORDER — OXYCODONE-ACETAMINOPHEN 5-325 MG PO TABS: 1.0000 | ORAL_TABLET | ORAL | 0 refills | Status: DC | PRN

## 2018-01-04 MED ORDER — FLUTICASONE PROPIONATE 50 MCG/ACT NA SUSP: 1.0000 | Freq: Two times a day (BID) | NASAL | 2 refills | Status: DC

## 2018-01-04 NOTE — Lactation Note (Signed)
This note was copied from a baby's chart. Lactation Consultation Note; Mom reports pumping is going well. Has pumped 5-6 times in last 24 hours. Is putting baby to the breast at a few feedings but she gets sleepy after a few sucks. Reports breasts are feeling fuller this morning and pumped 3 Colostrum tubes full at the last pumping.. Has personal Medela pump for home. Encouraged to call for assist when baby more ready to breast feed. Reviewed pumping rooms in NICU and can bring pump pieces when visiting baby. No questions at present. To call prn  Patient Name: Hannah Georgina PeerJacquelyn Greaves RUEAV'WToday's Date: 01/04/2018 Reason for consult: Follow-up assessment;NICU baby;Late-preterm 34-36.6wks   Maternal Data Formula Feeding for Exclusion: No Has patient been taught Hand Expression?: Yes  Feeding Feeding Type: Breast Milk Nipple Type: Slow - flow Length of feed: 35 min(PO 15 min NG 20 min)  LATCH Score                   Interventions    Lactation Tools Discussed/Used Tools: Pump Breast pump type: Double-Electric Breast Pump   Consult Status Consult Status: Complete    Pamelia HoitWeeks, Tiarra Anastacio D 01/04/2018, 10:42 AM

## 2018-01-04 NOTE — Discharge Summary (Signed)
OB Discharge Summary     Patient Name: Hannah Floyd DOB: 03/13/1986 MRN: [id number]  Date of admission: 01/01/2018 Delivering MD: Olivia Mackie   Date of discharge: 01/04/2018  Admitting diagnosis: Placenta Previa, Previous Cesarean Section x 2 Intrauterine pregnancy: [redacted]w[redacted]d     Secondary diagnosis:  Principal Problem:   Cesarean delivery delivered 1/9 Active Problems:   Previous cesarean delivery affecting pregnancy   Postoperative anemia due to acute blood loss   Total placenta previa   Postpartum care following cesarean delivery      Discharge diagnosis: Preterm Pregnancy Delivered, Anemia and hypothyroidism                                                                                                 Complications: None  Hospital course:  Sceduled C/S   32 y.o. yo [id number] at [redacted]w[redacted]d was admitted to the hospital 01/01/2018 for scheduled cesarean section with the following indication:Previa, Prior Uterine Surgery and IUGR.  Membrane Rupture Time/Date: 1:18 PM ,01/01/2018   Patient delivered a Viable infant.01/01/2018  Details of operation can be found in separate operative note.  Pateint had an uncomplicated postpartum course.  She is ambulating, tolerating a regular diet, passing flatus, and urinating well. Patient is discharged home in stable condition on  01/04/18         Physical exam  Vitals:   01/03/18 1526 01/03/18 2320 01/04/18 0415 01/04/18 0804  BP: (!) 146/82 124/79 138/84 132/87  Pulse: 87 76 70 75  Resp: 18 17 17 18   Temp: 99.7 F (37.6 C) 98.4 F (36.9 C) 97.7 F (36.5 C) 97.9 F (36.6 C)  TempSrc: Oral Oral Oral Oral  SpO2: 100% 100% 99% 99%  Weight:      Height:       General: alert, cooperative and no distress Lochia: appropriate Uterine Fundus: firm Incision: Dressing is clean, dry, and intact DVT Evaluation: No cords or calf tenderness. No significant calf/ankle edema. Labs: Lab Results  Component Value Date   WBC 10.3 01/02/2018   HGB  10.1 (L) 01/02/2018   HCT 30.1 (L) 01/02/2018   MCV 85.3 01/02/2018   PLT 210 01/02/2018   CMP Latest Ref Rng & Units 01/30/2016  Glucose 65 - 99 mg/dL 147(W)  BUN 6 - 20 mg/dL 13  Creatinine 2.95 - 6.21 mg/dL 3.08(M)  Sodium 578 - 469 mmol/L 137  Potassium 3.5 - 5.1 mmol/L 4.2  Chloride 101 - 111 mmol/L 102  CO2 22 - 32 mmol/L 26  Calcium 8.9 - 10.3 mg/dL 9.1  Total Protein 6.5 - 8.1 g/dL 7.5  Total Bilirubin 0.3 - 1.2 mg/dL 6.2(X)  Alkaline Phos 38 - 126 U/L 72  AST 15 - 41 U/L 18  ALT 14 - 54 U/L 18    Discharge instruction: per After Visit Summary and "Baby and Me Booklet".  After visit meds:  Allergies as of 01/04/2018   No Known Allergies     Medication List    TAKE these medications   acetaminophen 325 MG tablet Commonly known as:  TYLENOL Take 2 tablets (650 mg total) by  mouth every 4 (four) hours as needed (for pain scale < 4).   coconut oil Oil Apply 1 application topically as needed.   fluticasone 50 MCG/ACT nasal spray Commonly known as:  FLONASE Place 1 spray into both nostrils 2 (two) times daily.   ibuprofen 600 MG tablet Commonly known as:  ADVIL,MOTRIN Take 1 tablet (600 mg total) by mouth every 6 (six) hours.   oxyCODONE-acetaminophen 5-325 MG tablet Commonly known as:  PERCOCET/ROXICET Take 1 tablet by mouth every 4 (four) hours as needed (pain scale 4-7).   PNV PO Take 1 tablet by mouth daily.   PROMETRIUM 200 MG capsule Generic drug:  progesterone Place 200 mg daily vaginally.   senna-docusate 8.6-50 MG tablet Commonly known as:  Senokot-S Take 2 tablets by mouth daily. Start taking on:  01/05/2018   simethicone 80 MG chewable tablet Commonly known as:  MYLICON Chew 1 tablet (80 mg total) by mouth 3 (three) times daily after meals.   SYNTHROID 75 MCG tablet Generic drug:  levothyroxine Take 75 mcg by mouth daily before breakfast.       Diet: routine diet  Activity: Advance as tolerated. Pelvic rest for 6 weeks.   Outpatient  follow up:[facility] weeks [facility]  Postpartum contraception: Not Discussed  Newborn Data: Live born female Clara Birth Weight: 4 lb 0.9 oz (1840 g) APGAR: 6, 9  Newborn Delivery   Birth date/time:  01/01/2018 13:18:00 Delivery type:  C-Section, Low Transverse C-section categorization:  Repeat     Baby Feeding: expressed breast milk Disposition:NICU   01/04/2018 Neta Mendsaniela C Paul, CNM

## 2018-01-06 MED FILL — LEVOTHYROXINE 75 MCG TABLET: 75 | 30 days supply | Qty: 30 | Fill #0

## 2018-01-07 ENCOUNTER — Ambulatory Visit: Payer: Self-pay

## 2018-01-07 MED FILL — IBUPROFEN 600 MG TABLET: 600 | 7 days supply | Qty: 30 | Fill #0

## 2018-01-07 NOTE — Lactation Note (Signed)
This note was copied from a baby's chart. Lactation Consultation Note  Patient Name: Hannah Floyd UJWJX'BToday's Date: 01/07/2018 Reason for consult: Follow-up assessment;NICU baby;Other (Comment)(Plugged ducts)   Called by RN to meet with mom in regards to plugged ducts. Mom is experiencing plugged ducts to outer aspect of left breast. Enc mom to pump at least every 2 hours after application of moist warm compresses for about 20 minutes. Enc mom to massage area well with pumping. Mom is not wearing under wire bras and feels her flanges fit well. She may need a check on her flange fit. Reviewed importance of getting plug open to protect milk supply.   Reviewed with mom the s/s of Mastitis and to call OB if fever develops. Mom denies s/s Mastitis at this time. Mom to call LC back if not improving.    Maternal Data    Feeding Feeding Type: Breast Milk Nipple Type: Slow - flow Length of feed: 30 min(20 nippling/10 gavage)  LATCH Score                   Interventions    Lactation Tools Discussed/Used     Consult Status Consult Status: PRN Follow-up type: Call as needed    Ed BlalockSharon S Hice 01/07/2018, 11:22 AM

## 2018-02-24 DIAGNOSIS — Z1151 Encounter for screening for human papillomavirus (HPV): Secondary | ICD-10-CM | POA: Diagnosis not present

## 2018-03-13 ENCOUNTER — Ambulatory Visit: Payer: Self-pay | Admitting: *Deleted

## 2018-03-13 NOTE — Telephone Encounter (Signed)
Reason for Disposition  . Nursing judgment or information in reference    Protocols used: NO GUIDELINE AVAILABLE-A-AH

## 2018-03-13 NOTE — Telephone Encounter (Signed)
Patient is concerned about a lump in her throat- she thinks it may be related to her thyroid. Patient states she had a sore throat last week and thought the lump may have been related to that- but the sore throat is gone and she still feels the lump. She states it is not sore. She delivered a baby in January and she has not started a cycle yet and is not breast feeding. She is abstaining at this time. Patient states she does have a history of thyroid problems and feels this may be related- she would like to have this checked. Answer Assessment - Initial Assessment Questions 1. REASON FOR CALL: "What is your main concern right now?"     Patient noticed swelling in neck last week- she did have sore throat- but the sore throat went away and there is a slight lump on R 2. ONSET: "When did the ___ start?"     Last week 3. SEVERITY: "How bad is the ___?"     Not as visible as last week- but present in the same area 4. FEVER: "Do you have a fever?"     No fever 5. OTHER SYMPTOMS: "Do you have any other new symptoms?"     No other symptoms 6. INTERVENTIONS AND RESPONSE: "What have you done so far to try to make this better? What medications have you used?"     no 7. PREGNANCY: "Is there any chance you are pregnant?"     No- delivered 1/9 -  Abstaining  Protocols used: NO GUIDELINE AVAILABLE-A-AH

## 2018-03-14 ENCOUNTER — Encounter: Payer: Self-pay | Admitting: Family Medicine

## 2018-03-14 ENCOUNTER — Ambulatory Visit: Payer: 59 | Admitting: Family Medicine

## 2018-03-14 ENCOUNTER — Other Ambulatory Visit: Payer: Self-pay

## 2018-03-14 VITALS — BP 126/78 | HR 91 | Temp 97.6°F | Resp 16 | Ht 61.0 in | Wt 174.8 lb

## 2018-03-14 DIAGNOSIS — E041 Nontoxic single thyroid nodule: Secondary | ICD-10-CM

## 2018-03-14 NOTE — Progress Notes (Signed)
   3/22/20198:31 AM  Hannah Floyd Nov 12, 1986, 32 y.o. female [id number]  Chief Complaint  Patient presents with  . lump on right side of throat    noticed on last week with some sore throat, no st today, per pt ? lymph node swollen and pt has hx of thyroid    HPI:   Patient is a 32 y.o. female with past medical history significant for hypothyroidism who presents today for 1 week of lump on right side of neck, not growing, nontender. No overlying skin changes. No difficulties with breathing or swallowing. No changes in temperature tolerance, no changes to hair, skin dry but that is usual for this time of year, no changes to bowel function.  No palpitations. Changes in weight and energy due to recent pregnancy/delivery. Hypothyroidism, started on meds during pregnancy postpartum 01/01/18 Not breastfeeding  [facility] 03/14/2018 04/23/2017  Decreased Interest 0 0  Down, Depressed, Hopeless 0 0  PHQ - 2 Score 0 0    No Known Allergies  Prior to Admission medications   Medication Sig Start Date End Date Taking? Authorizing Provider  levothyroxine (SYNTHROID) 75 MCG tablet Take 75 mcg by mouth daily before breakfast.    Yes [provider]    Past Medical History:  Diagnosis Date  . Folliculitis   . Hypothyroidism   . Postoperative anemia due to acute blood loss 01/02/2018  . Vaginal Pap smear, abnormal     Past Surgical History:  Procedure Laterality Date  . APPENDECTOMY    . CESAREAN SECTION    . CESAREAN SECTION N/A 01/01/2018   Procedure: Repeat CESAREAN SECTION;  Surgeon: Olivia Mackieaavon, Richard, MD;  [facility];  Service: Obstetrics;  Laterality: N/A;  EDD: 01/27/18  . LAPAROSCOPIC APPENDECTOMY N/A 01/30/2016   Procedure: APPENDECTOMY LAPAROSCOPIC;  Surgeon: Avel Peaceodd Rosenbower, MD;  Location: [facility];  Service: General;  Laterality: N/A;  . LEEP      Social History   Tobacco Use  . Smoking status: Never Smoker  . Smokeless tobacco: Never  Used  Substance Use Topics  . Alcohol use: No    Family History  Problem Relation Age of Onset  . Hypertension Mother   . Hyperlipidemia Mother   . COPD Mother   . Hypothyroidism Mother     ROS Per hpi  OBJECTIVE:  Blood pressure 126/78, pulse 91, temperature 97.6 F (36.4 C), temperature source Oral, resp. rate 16, height 5\' 1"  (1.549 m), weight 174 lb 12.8 oz (79.3 kg), last menstrual period 04/10/2017, SpO2 98 %, unknown if currently breastfeeding.  Physical Exam  Constitutional: She is well-developed, well-nourished, and in no distress.  HENT:  Head: Normocephalic and atraumatic.  Right Ear: External ear normal.  Left Ear: External ear normal.  Mouth/Throat: Oropharynx is clear and moist.  Eyes: Pupils are equal, round, and reactive to light. Conjunctivae and EOM are normal.  Neck: Neck supple. Thyromegaly (firm nodule that seems to stem from upper pole of right thyroid lobe) present.  Lymphadenopathy:    She has no cervical adenopathy.     ASSESSMENT and PLAN  1. Thyroid nodule - US Soft Tissue Head/Neck; Future - TSH  Return for after ultrasound.    Myles LippsIrma M Santiago, MD [facility] [address] [place], [place] [address] Ph.  [phone] Fax [phone]

## 2018-03-14 NOTE — Patient Instructions (Addendum)
  I have ordered either a referral or a diagnostic image. Please be mindful that it usually takes about 2 weeks to be called for an appointment. However if you have not be called for your appointment, please call us at [phone] and ask to speak with a referral clerk to further inquire about either your referral of diagnostic image.   IF you received an x-ray today, you will receive an invoice from [facility]. Please contact [facility] at [phone] with questions or concerns regarding your invoice.   IF you received labwork today, you will receive an invoice from [facility]. Please contact [facility] at [phone] with questions or concerns regarding your invoice.   Our billing staff will not be able to assist you with questions regarding bills from these companies.  You will be contacted with the lab results as soon as they are available. The fastest way to get your results is to activate your My Chart account. Instructions are located on the last page of this paperwork. If you have not heard from us regarding the results in 2 weeks, please contact this office.     

## 2018-03-15 LAB — TSH: TSH: 0.362 u[IU]/mL — ABNORMAL LOW (ref 0.450–4.500)

## 2018-03-18 ENCOUNTER — Ambulatory Visit
Admission: RE | Admit: 2018-03-18 | Discharge: 2018-03-18 | Disposition: A | Discharge status: Home / Self Care | Payer: 59 | Source: Ambulatory Visit | Attending: Family Medicine | Admitting: Family Medicine

## 2018-03-18 DIAGNOSIS — E041 Nontoxic single thyroid nodule: Secondary | ICD-10-CM | POA: Diagnosis not present

## 2018-03-24 ENCOUNTER — Telehealth: Payer: Self-pay

## 2018-03-24 NOTE — Telephone Encounter (Signed)
Copied from CRM [phone]. Topic: Quick Communication - Other Results >> Mar 21, 2018  1:09 PM Crist InfanteHarrald, Kathy J wrote: Pt calling for thyroid US results.

## 2018-03-24 NOTE — Telephone Encounter (Signed)
Provider, please advise.  

## 2018-03-25 ENCOUNTER — Other Ambulatory Visit: Payer: Self-pay | Admitting: Family Medicine

## 2018-03-25 DIAGNOSIS — E039 Hypothyroidism, unspecified: Secondary | ICD-10-CM

## 2018-03-25 MED ORDER — LEVOTHYROXINE SODIUM 50 MCG PO TABS: 50.0000 ug | ORAL_TABLET | Freq: Every day | ORAL | 3 refills | Status: DC

## 2018-03-25 NOTE — Telephone Encounter (Signed)
Please see result note on TSH. thanks

## 2018-03-26 NOTE — Telephone Encounter (Signed)
Results reviewed by patient on MyChart.

## 2018-11-13 IMAGING — US US THYROID
1 series · 14 of 25 positions shown · non-contrast
Comparison: None.

CLINICAL DATA: Palpable abnormality. Right upper pole nodule by
physical exam.

EXAM:
THYROID ULTRASOUND
TECHNIQUE: Ultrasound examination of the thyroid gland and adjacent soft
tissues was performed.

[Series 1: us thyroid · 0.08mm/px · 14 of 50 slices shown]
[im 1/50]
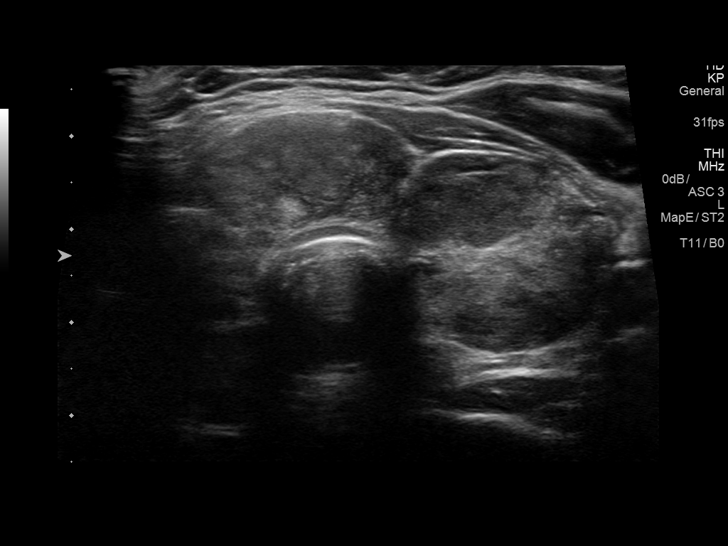
[im 5/50]
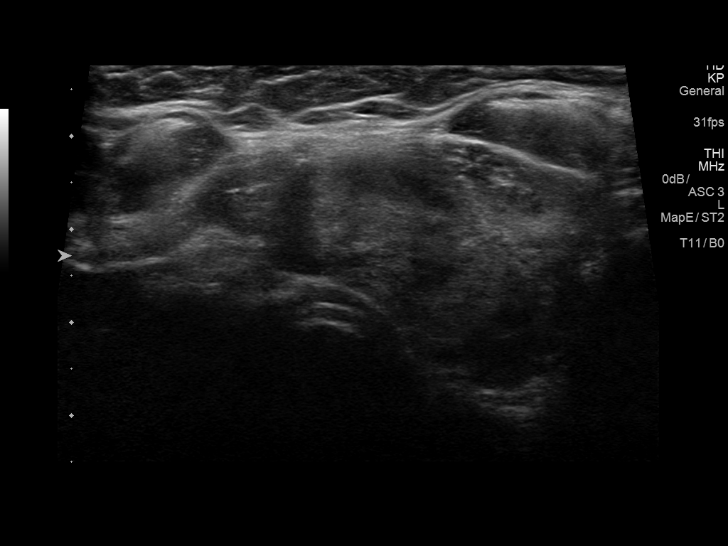
[im 9/50]
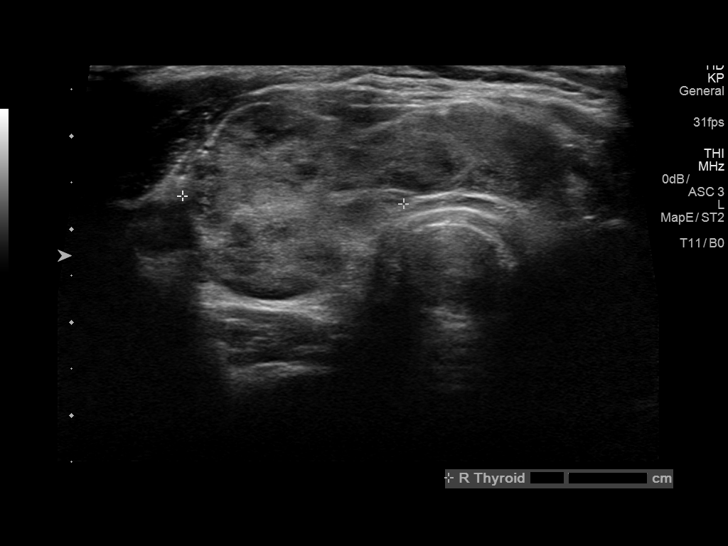
[im 13/50]
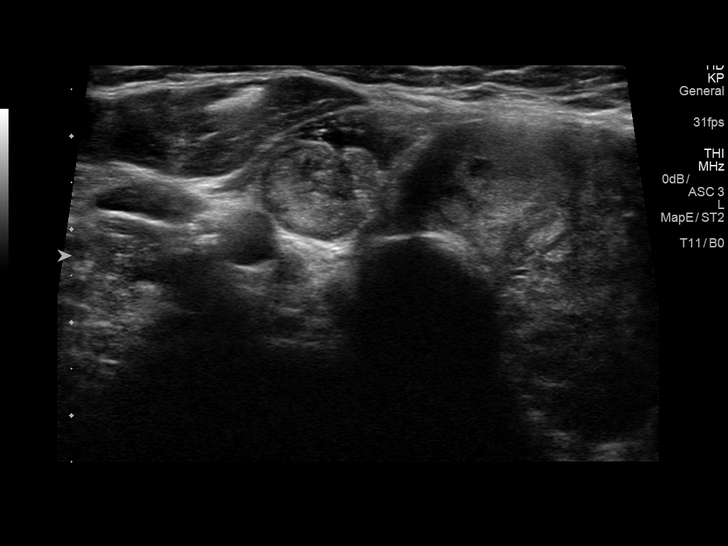
[im 17/50]
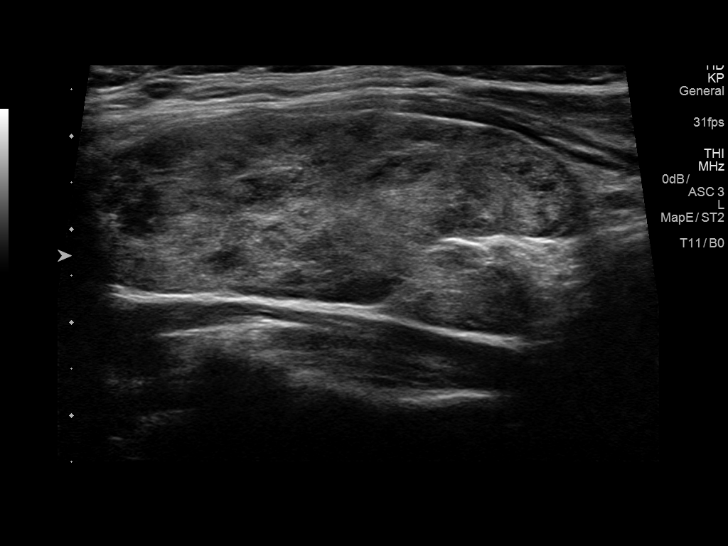
[im 19/50]
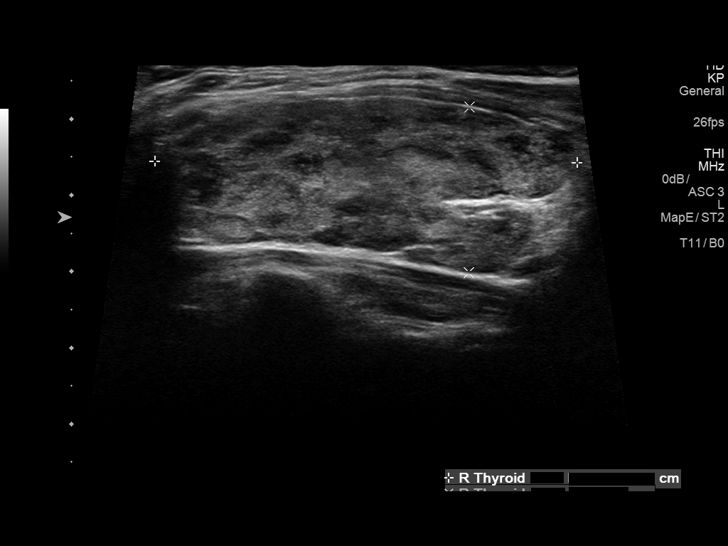
[im 23/50]
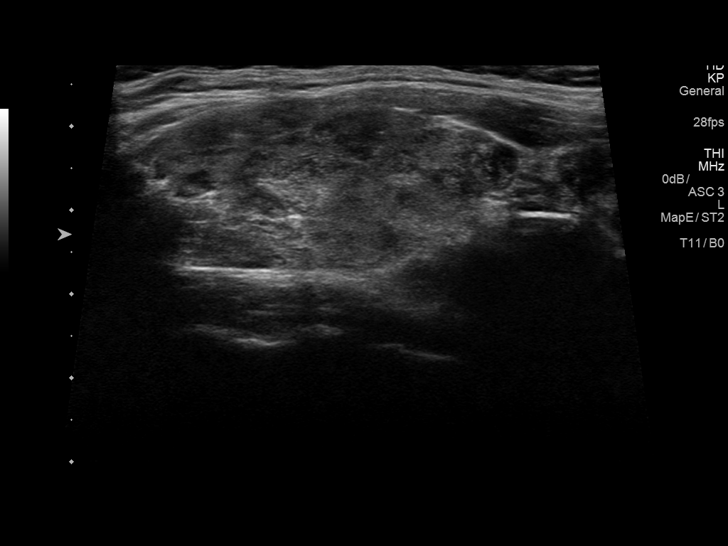
[im 27/50]
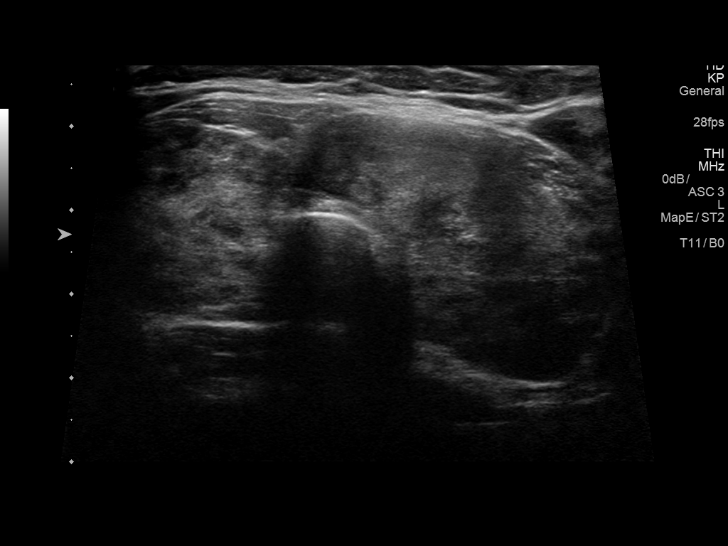
[im 31/50]
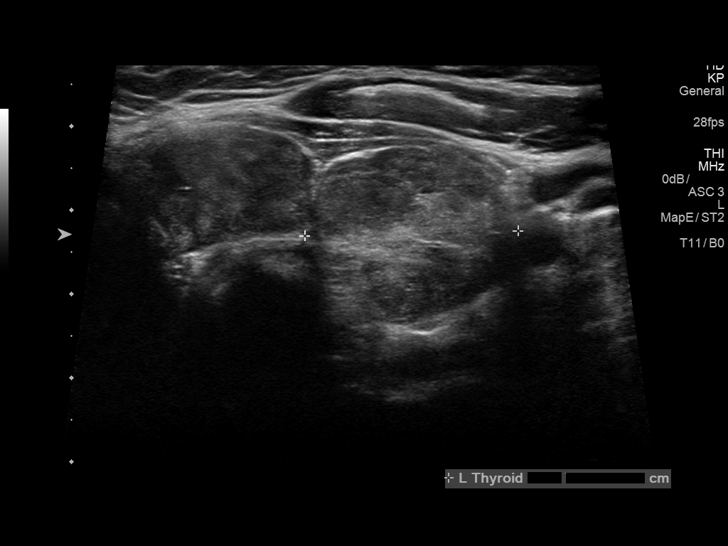
[im 33/50]
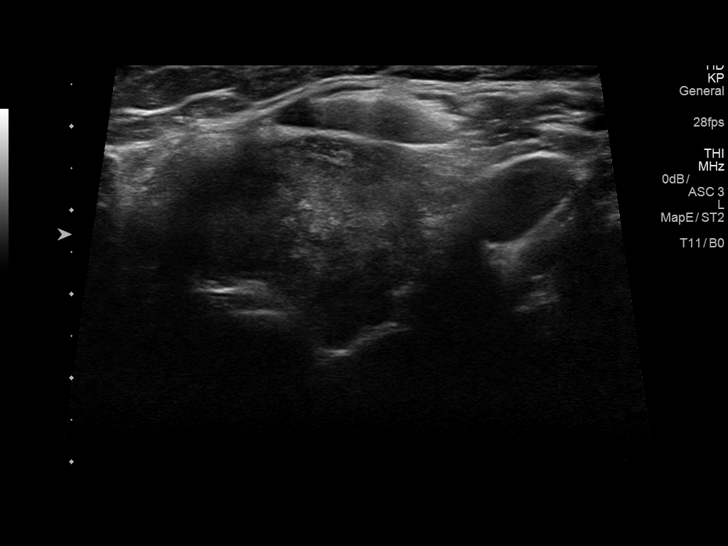
[im 37/50]
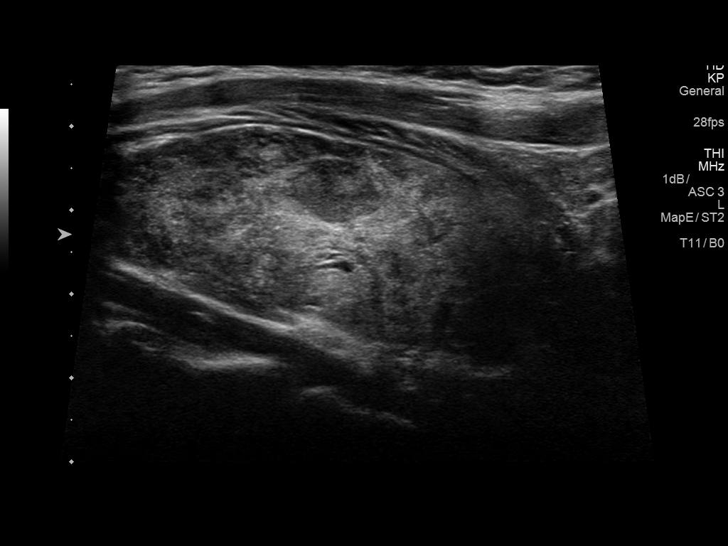
[im 41/50]
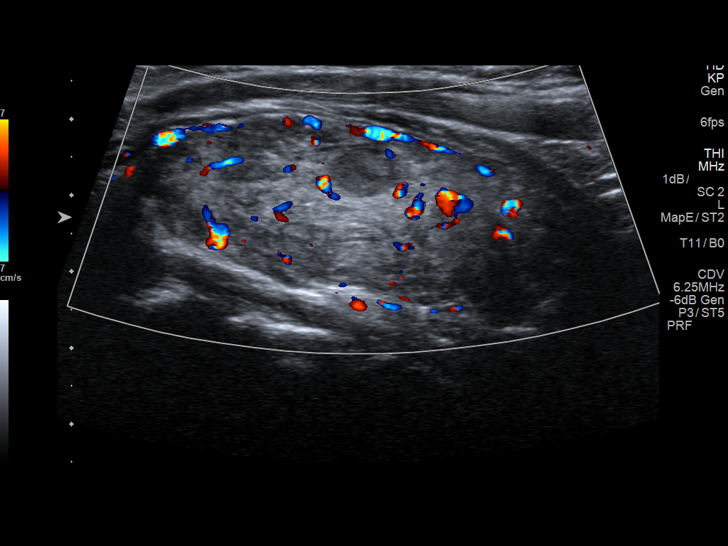
[im 45/50]
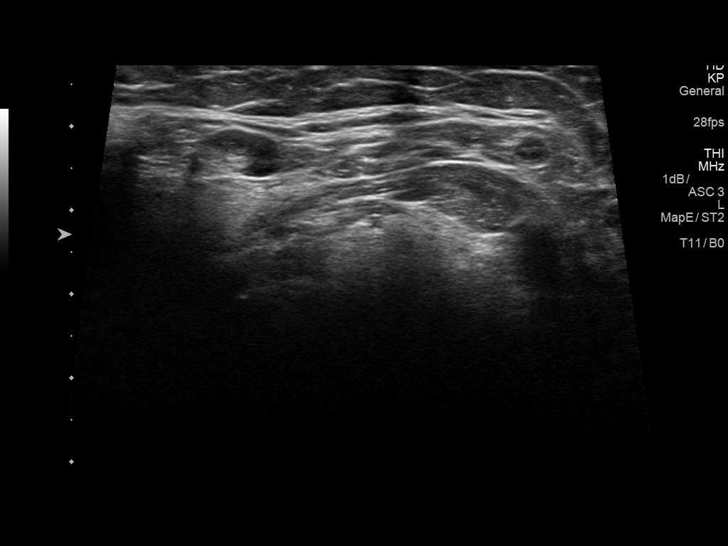
[im 50/50]
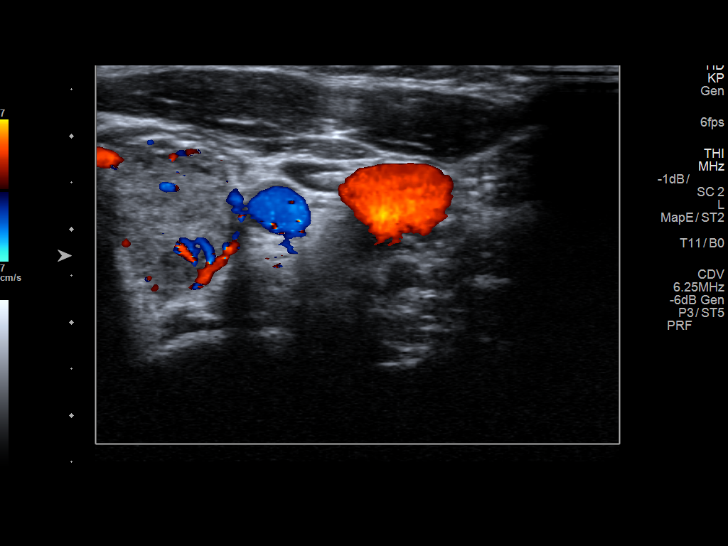

[14 of 25 positions shown; findings below may reference images not displayed]

FINDINGS: Parenchymal Echotexture: Markedly heterogenous

Isthmus: 1.2 cm

Right lobe: 5.5 x 2.2 x 2.4 cm

Left lobe: 5.9 x 2.3 x 2.5 cm

_________________________________________________________

Estimated total number of nodules >/= 1 cm: 0

Number of spongiform nodules >/=  2 cm not described below (TR1): 0

Number of mixed cystic and solid nodules >/= 1.5 cm not described
below (TR2): 0

_________________________________________________________

No discrete nodules are seen within the thyroid gland.
IMPRESSION: Heterogeneous gland without focal nodule. The gland is mildly
enlarged. This study does not meet criteria for biopsy nor
follow-up.

The above is in keeping with the ACR TI-RADS recommendations - [HOSPITAL] 3333;[DATE].

## 2018-11-25 DIAGNOSIS — N760 Acute vaginitis: Secondary | ICD-10-CM | POA: Diagnosis not present

## 2018-11-25 DIAGNOSIS — B373 Candidiasis of vulva and vagina: Secondary | ICD-10-CM | POA: Diagnosis not present

## 2018-11-25 DIAGNOSIS — Z113 Encounter for screening for infections with a predominantly sexual mode of transmission: Secondary | ICD-10-CM | POA: Diagnosis not present

## 2018-11-25 DIAGNOSIS — Z114 Encounter for screening for human immunodeficiency virus [HIV]: Secondary | ICD-10-CM | POA: Diagnosis not present

## 2018-11-25 DIAGNOSIS — Z118 Encounter for screening for other infectious and parasitic diseases: Secondary | ICD-10-CM | POA: Diagnosis not present

## 2018-11-25 DIAGNOSIS — Z1159 Encounter for screening for other viral diseases: Secondary | ICD-10-CM | POA: Diagnosis not present

## 2018-11-25 MED FILL — FLUCONAZOLE 150 MG TABS: 150 | 4 days supply | Qty: 2 | Fill #0

## 2018-11-25 MED FILL — metroNIDAZOLE 500 MG TABS: 500 | 7 days supply | Qty: 14 | Fill #0

## 2019-01-09 DIAGNOSIS — Z30430 Encounter for insertion of intrauterine contraceptive device: Secondary | ICD-10-CM | POA: Diagnosis not present

## 2019-06-23 MED FILL — CEPHALEXIN 500 MG CAPSULE: 500 | 5 days supply | Qty: 10 | Fill #0

## 2019-06-23 MED FILL — FLUCONAZOLE 150 MG TABS: 150 | 1 days supply | Qty: 1 | Fill #0

## 2019-06-23 MED FILL — PHENAZOPYRIDINE 200 MG TAB: 200 | 3 days supply | Qty: 9 | Fill #0

## 2019-07-17 DIAGNOSIS — Z1321 Encounter for screening for nutritional disorder: Secondary | ICD-10-CM | POA: Diagnosis not present

## 2019-07-17 DIAGNOSIS — Z01419 Encounter for gynecological examination (general) (routine) without abnormal findings: Secondary | ICD-10-CM | POA: Diagnosis not present

## 2019-07-17 DIAGNOSIS — Z1329 Encounter for screening for other suspected endocrine disorder: Secondary | ICD-10-CM | POA: Diagnosis not present

## 2019-07-17 DIAGNOSIS — Z6827 Body mass index (BMI) 27.0-27.9, adult: Secondary | ICD-10-CM | POA: Diagnosis not present

## 2019-07-17 DIAGNOSIS — Z130 Encounter for screening for diseases of the blood and blood-forming organs and certain disorders involving the immune mechanism: Secondary | ICD-10-CM | POA: Diagnosis not present

## 2019-07-17 DIAGNOSIS — Z0000 Encounter for general adult medical examination without abnormal findings: Secondary | ICD-10-CM | POA: Diagnosis not present

## 2019-07-17 DIAGNOSIS — Z13220 Encounter for screening for lipoid disorders: Secondary | ICD-10-CM | POA: Diagnosis not present

## 2019-09-25 MED FILL — METRONIDAZOLE 500 MG TABS: 500 | 7 days supply | Qty: 14 | Fill #0

## 2019-09-25 MED FILL — FLUCONAZOLE 150 MG TABS: 150 | 2 days supply | Qty: 2 | Fill #0

## 2019-11-25 DIAGNOSIS — Z20828 Contact with and (suspected) exposure to other viral communicable diseases: Secondary | ICD-10-CM | POA: Diagnosis not present

## 2019-12-10 DIAGNOSIS — Z20828 Contact with and (suspected) exposure to other viral communicable diseases: Secondary | ICD-10-CM | POA: Diagnosis not present

## 2019-12-10 DIAGNOSIS — R519 Headache, unspecified: Secondary | ICD-10-CM | POA: Diagnosis not present

## 2020-08-18 DIAGNOSIS — Z01419 Encounter for gynecological examination (general) (routine) without abnormal findings: Secondary | ICD-10-CM | POA: Diagnosis not present

## 2020-08-18 DIAGNOSIS — Z1151 Encounter for screening for human papillomavirus (HPV): Secondary | ICD-10-CM | POA: Diagnosis not present

## 2020-08-18 DIAGNOSIS — F419 Anxiety disorder, unspecified: Secondary | ICD-10-CM | POA: Diagnosis not present

## 2020-08-18 DIAGNOSIS — Z6828 Body mass index (BMI) 28.0-28.9, adult: Secondary | ICD-10-CM | POA: Diagnosis not present

## 2020-08-18 DIAGNOSIS — Z01411 Encounter for gynecological examination (general) (routine) with abnormal findings: Secondary | ICD-10-CM | POA: Diagnosis not present

## 2020-08-18 MED FILL — ESCITALOPRAM 5 MG TABLET: 5 | 30 days supply | Qty: 30 | Fill #0

## 2020-11-21 DIAGNOSIS — M25551 Pain in right hip: Secondary | ICD-10-CM | POA: Diagnosis not present

## 2020-12-14 ENCOUNTER — Other Ambulatory Visit: Payer: Self-pay | Admitting: Family Medicine

## 2020-12-14 ENCOUNTER — Emergency Department
Admission: EM | Admit: 2020-12-14 | Discharge: 2020-12-14 | Disposition: A | Discharge status: Home / Self Care | Payer: 59 | Source: Home / Self Care

## 2020-12-14 ENCOUNTER — Telehealth: Payer: Self-pay

## 2020-12-14 ENCOUNTER — Other Ambulatory Visit: Payer: Self-pay

## 2020-12-14 DIAGNOSIS — J320 Chronic maxillary sinusitis: Secondary | ICD-10-CM

## 2020-12-14 MED ORDER — HYDROCODONE-HOMATROPINE 5-1.5 MG/5ML PO SYRP: 5.0000 mL | ORAL_SOLUTION | Freq: Four times a day (QID) | ORAL | 0 refills | Status: DC | PRN

## 2020-12-14 MED ORDER — AMOXICILLIN 500 MG PO CAPS: 500.0000 mg | ORAL_CAPSULE | Freq: Three times a day (TID) | ORAL | 0 refills | Status: DC

## 2020-12-14 MED ORDER — BENZONATATE 100 MG PO CAPS: 100.0000 mg | ORAL_CAPSULE | Freq: Three times a day (TID) | ORAL | 0 refills | Status: DC

## 2020-12-14 MED FILL — HYCODAN 5-1.5 MG/5ML SYRP: 5-1.5 | 6 days supply | Qty: 120 | Fill #0

## 2020-12-14 MED FILL — AMOXICILLIN 500 MG CAPSULE: 500 | 7 days supply | Qty: 21 | Fill #0

## 2020-12-14 NOTE — ED Triage Notes (Addendum)
Pt was diagnosed with Covid in November and has had persistent cough for over month now. Pt states chest fells heavy and she has had a ne onset of runny nose. No fever. Pt has taken otc cold and flu medications that helps ease symptoms. Pt vaccinated for flu

## 2020-12-14 NOTE — Telephone Encounter (Signed)
Fax received medication ordered was not approved dr. Hyacinth Meeker changed order to tessalon pearls

## 2020-12-14 NOTE — Discharge Instructions (Addendum)
Drink plenty of fluids Take Mucinex

## 2020-12-14 NOTE — ED Provider Notes (Signed)
[facility]    CSN: [id number] Arrival date & time: 12/14/20  1429      History   Chief Complaint Chief Complaint  Patient presents with  . Cough    HPI Hannah Floyd is a 34 y.o. female.   Patient complains of a productive cough.  No fever or chills.  The sputum is brown to yellow.  There is some contribution from both her head and chest she feels.  She had Covid early last month.  HPI  Past Medical History:  Diagnosis Date  . Folliculitis   . Hypothyroidism   . Postoperative anemia due to acute blood loss 01/02/2018  . Vaginal Pap smear, abnormal     Patient Active Problem List   Diagnosis Date Noted  . Total placenta previa 01/04/2018  . Postpartum care following cesarean delivery 01/04/2018  . Cesarean delivery delivered 1/9 01/04/2018  . Postoperative anemia due to acute blood loss 01/02/2018  . Previous cesarean delivery affecting pregnancy 01/01/2018    Past Surgical History:  Procedure Laterality Date  . APPENDECTOMY    . CESAREAN SECTION    . CESAREAN SECTION N/A 01/01/2018   Procedure: Repeat CESAREAN SECTION;  Surgeon: Olivia Mackie, MD;  Location: [facility] BIRTHING SUIT[facility];  Service: Obstetrics;  Laterality: N/A;  EDD: 01/27/18  . LAPAROSCOPIC APPENDECTOMY N/A 01/30/2016   Procedure: APPENDECTOMY LAPAROSCOPIC;  Surgeon: Avel Peace, MD;  Location: [facility];  Service: General;  Laterality: N/A;  . LEEP      OB History    Gravida  4   Para  3   Term  2   Preterm  1   AB  1   Living  3     SAB  1   IAB      Ectopic      Multiple  0   Live Births  3            Home Medications    Prior to Admission medications   Medication Sig Start Date End Date Taking? Authorizing Provider  levothyroxine (SYNTHROID, LEVOTHROID) 50 MCG tablet Take 1 tablet (50 mcg total) by mouth daily before breakfast. 03/25/18   Lezlie Lye, Meda Coffee, MD    Family History Family History  Problem Relation Age of Onset  . Hypertension Mother   .  Hyperlipidemia Mother   . COPD Mother   . Hypothyroidism Mother     Social History Social History   Tobacco Use  . Smoking status: Never Smoker  . Smokeless tobacco: Never Used  Vaping Use  . Vaping Use: Never used  Substance Use Topics  . Alcohol use: Yes    Comment: occ  . Drug use: No     Allergies   Patient has no known allergies.   Review of Systems Review of Systems  HENT: Positive for congestion and sore throat.   Respiratory: Positive for cough.   All other systems reviewed and are negative.    Physical Exam Triage Vital Signs ED Triage Vitals  Enc Vitals Group     BP 12/14/20 1444 112/82     Pulse Rate 12/14/20 1444 (!) 102     Resp --      Temp 12/14/20 1444 98.3 F (36.8 C)     Temp Source 12/14/20 1444 Oral     SpO2 12/14/20 1444 98 %     Weight --      Height 12/14/20 1442 5\' 1"  (1.549 m)     Head Circumference --  Peak Flow --      Pain Score 12/14/20 1441 0     Pain Loc --      Pain Edu? --      Excl. in GC? --    No data found.  Updated Vital Signs BP 112/82 (BP Location: Left Arm)   Pulse (!) 102   Temp 98.3 F (36.8 C) (Oral)   Ht 5\' 1"  (1.549 m)   LMP  (Within Years)   SpO2 98%   BMI 33.03 kg/m   Visual Acuity Right Eye Distance:   Left Eye Distance:   Bilateral Distance:    Right Eye Near:   Left Eye Near:    Bilateral Near:     Physical Exam Vitals and nursing note reviewed.  Constitutional:      Appearance: Normal appearance.  HENT:     Head: Normocephalic.     Comments: Tenderness with percussion over maxillary sinuses    Right Ear: Tympanic membrane normal.     Left Ear: Tympanic membrane normal.     Mouth/Throat:     Mouth: Mucous membranes are moist.  Cardiovascular:     Rate and Rhythm: Normal rate and regular rhythm.  Pulmonary:     Effort: Pulmonary effort is normal.     Breath sounds: Normal breath sounds. No wheezing or rhonchi.  Neurological:     General: No focal deficit present.      Mental Status: She is alert and oriented to person, place, and time.      UC Treatments / Results  Labs (all labs ordered are listed, but only abnormal results are displayed) Labs Reviewed - No data to display  EKG   Radiology No results found.  Procedures Procedures (including critical care time)  Medications Ordered in UC Medications - No data to display  Initial Impression / Assessment and Plan / UC Course  I have reviewed the triage vital signs and the nursing notes.  Pertinent labs & imaging results that were available during my care of the patient were reviewed by me and considered in my medical decision making (see chart for details).     Sinusitis.  Will treat with amoxicillin and Mucinex Final Clinical Impressions(s) / UC Diagnoses   Final diagnoses:  None   Discharge Instructions   None    ED Prescriptions    None     PDMP not reviewed this encounter.   , MD 12/14/20 1520

## 2020-12-15 MED FILL — BENZONATATE 100 MG CAPS: 100 | 7 days supply | Qty: 21 | Fill #0

## 2021-01-01 DIAGNOSIS — Z20822 Contact with and (suspected) exposure to covid-19: Secondary | ICD-10-CM | POA: Diagnosis not present

## 2021-01-24 DIAGNOSIS — M25551 Pain in right hip: Secondary | ICD-10-CM | POA: Diagnosis not present

## 2021-01-31 DIAGNOSIS — M25551 Pain in right hip: Secondary | ICD-10-CM | POA: Diagnosis not present

## 2021-10-30 DIAGNOSIS — N644 Mastodynia: Secondary | ICD-10-CM | POA: Diagnosis not present

## 2021-11-14 DIAGNOSIS — N6321 Unspecified lump in the left breast, upper outer quadrant: Secondary | ICD-10-CM | POA: Diagnosis not present

## 2021-11-14 DIAGNOSIS — R928 Other abnormal and inconclusive findings on diagnostic imaging of breast: Secondary | ICD-10-CM | POA: Diagnosis not present

## 2021-11-14 DIAGNOSIS — R922 Inconclusive mammogram: Secondary | ICD-10-CM | POA: Diagnosis not present

## 2022-01-03 DIAGNOSIS — Z6829 Body mass index (BMI) 29.0-29.9, adult: Secondary | ICD-10-CM | POA: Diagnosis not present

## 2022-01-03 DIAGNOSIS — R87619 Unspecified abnormal cytological findings in specimens from cervix uteri: Secondary | ICD-10-CM | POA: Diagnosis not present

## 2022-01-03 DIAGNOSIS — Z30431 Encounter for routine checking of intrauterine contraceptive device: Secondary | ICD-10-CM | POA: Diagnosis not present

## 2022-01-03 DIAGNOSIS — Z13220 Encounter for screening for lipoid disorders: Secondary | ICD-10-CM | POA: Diagnosis not present

## 2022-01-03 DIAGNOSIS — Z01419 Encounter for gynecological examination (general) (routine) without abnormal findings: Secondary | ICD-10-CM | POA: Diagnosis not present

## 2022-01-03 DIAGNOSIS — Z0000 Encounter for general adult medical examination without abnormal findings: Secondary | ICD-10-CM | POA: Diagnosis not present

## 2022-01-03 DIAGNOSIS — Z113 Encounter for screening for infections with a predominantly sexual mode of transmission: Secondary | ICD-10-CM | POA: Diagnosis not present

## 2022-01-03 DIAGNOSIS — Z01411 Encounter for gynecological examination (general) (routine) with abnormal findings: Secondary | ICD-10-CM | POA: Diagnosis not present

## 2022-01-03 DIAGNOSIS — Z131 Encounter for screening for diabetes mellitus: Secondary | ICD-10-CM | POA: Diagnosis not present

## 2022-01-03 DIAGNOSIS — Z1329 Encounter for screening for other suspected endocrine disorder: Secondary | ICD-10-CM | POA: Diagnosis not present

## 2022-01-03 DIAGNOSIS — R7989 Other specified abnormal findings of blood chemistry: Secondary | ICD-10-CM | POA: Diagnosis not present

## 2022-01-03 DIAGNOSIS — Z124 Encounter for screening for malignant neoplasm of cervix: Secondary | ICD-10-CM | POA: Diagnosis not present

## 2022-01-04 ENCOUNTER — Other Ambulatory Visit (HOSPITAL_BASED_OUTPATIENT_CLINIC_OR_DEPARTMENT_OTHER): Payer: Self-pay

## 2022-01-04 MED ORDER — SYNTHROID 25 MCG PO TABS
ORAL_TABLET | ORAL | 1 refills | Status: DC
  Filled 2022-01-04 – 2022-02-06 (×2): qty 30, 30d supply, fill #0

## 2022-01-05 ENCOUNTER — Other Ambulatory Visit (HOSPITAL_BASED_OUTPATIENT_CLINIC_OR_DEPARTMENT_OTHER): Payer: Self-pay

## 2022-02-06 ENCOUNTER — Other Ambulatory Visit (HOSPITAL_COMMUNITY): Payer: Self-pay

## 2022-03-15 ENCOUNTER — Other Ambulatory Visit (HOSPITAL_BASED_OUTPATIENT_CLINIC_OR_DEPARTMENT_OTHER): Payer: Self-pay

## 2022-03-15 DIAGNOSIS — R7989 Other specified abnormal findings of blood chemistry: Secondary | ICD-10-CM | POA: Diagnosis not present

## 2022-03-16 ENCOUNTER — Other Ambulatory Visit (HOSPITAL_BASED_OUTPATIENT_CLINIC_OR_DEPARTMENT_OTHER): Payer: Self-pay

## 2022-03-16 MED ORDER — LEVOTHYROXINE SODIUM 50 MCG PO TABS
ORAL_TABLET | ORAL | 1 refills | Status: DC
  Filled 2022-03-16: qty 30, 30d supply, fill #0
  Filled 2022-05-25: qty 30, 30d supply, fill #1

## 2022-05-25 ENCOUNTER — Other Ambulatory Visit (HOSPITAL_BASED_OUTPATIENT_CLINIC_OR_DEPARTMENT_OTHER): Payer: Self-pay

## 2022-06-15 ENCOUNTER — Other Ambulatory Visit (HOSPITAL_BASED_OUTPATIENT_CLINIC_OR_DEPARTMENT_OTHER): Payer: Self-pay

## 2022-08-01 ENCOUNTER — Ambulatory Visit: Payer: 59 | Admitting: Family Medicine

## 2022-09-16 ENCOUNTER — Ambulatory Visit: Payer: 59

## 2022-12-30 ENCOUNTER — Ambulatory Visit (INDEPENDENT_AMBULATORY_CARE_PROVIDER_SITE_OTHER): Payer: Commercial Managed Care - PPO

## 2022-12-30 ENCOUNTER — Ambulatory Visit
Admission: EM | Admit: 2022-12-30 | Discharge: 2022-12-30 | Disposition: A | Discharge status: Home / Self Care | Payer: Commercial Managed Care - PPO | Attending: Family Medicine | Admitting: Family Medicine

## 2022-12-30 ENCOUNTER — Encounter: Payer: Self-pay | Admitting: Emergency Medicine

## 2022-12-30 DIAGNOSIS — M25522 Pain in left elbow: Secondary | ICD-10-CM | POA: Diagnosis not present

## 2022-12-30 DIAGNOSIS — M25422 Effusion, left elbow: Secondary | ICD-10-CM | POA: Diagnosis not present

## 2022-12-30 DIAGNOSIS — S59902A Unspecified injury of left elbow, initial encounter: Secondary | ICD-10-CM

## 2022-12-30 NOTE — ED Triage Notes (Signed)
Patient states that she was rollerskating yesterday and tried to catch herself from a fall.  Now she is having left elbow/forearm pain.  Patient has iced the area, denies any OTC pain meds.

## 2022-12-30 NOTE — ED Provider Notes (Signed)
[facility]    CSN: [id number] Arrival date & time: 12/30/22  0818      History   Chief Complaint Chief Complaint  Patient presents with   Elbow Injury    HPI Hannah Floyd is a 37 y.o. female.   HPI Patient fell onto outstretched arms while rollerskating.  She has progressive pain and swelling in her left elbow.  Unable to fully flex or extend her elbow.  Past Medical History:  Diagnosis Date   Folliculitis    Hypothyroidism    Postoperative anemia due to acute blood loss 01/02/2018   Vaginal Pap smear, abnormal     Patient Active Problem List   Diagnosis Date Noted   Total placenta previa 01/04/2018   Postpartum care following cesarean delivery 01/04/2018   Cesarean delivery delivered 1/9 01/04/2018   Postoperative anemia due to acute blood loss 01/02/2018   Previous cesarean delivery affecting pregnancy 01/01/2018    Past Surgical History:  Procedure Laterality Date   APPENDECTOMY     CESAREAN SECTION     CESAREAN SECTION N/A 01/01/2018   Procedure: Repeat CESAREAN SECTION;  Surgeon: Olivia Mackie, MD;  Location: [facility];  Service: Obstetrics;  Laterality: N/A;  EDD: 01/27/18   LAPAROSCOPIC APPENDECTOMY N/A 01/30/2016   Procedure: APPENDECTOMY LAPAROSCOPIC;  Surgeon: Avel Peace, MD;  Location: [facility];  Service: General;  Laterality: N/A;   LEEP      OB History     Gravida  4   Para  3   Term  2   Preterm  1   AB  1   Living  3      SAB  1   IAB      Ectopic      Multiple  0   Live Births  3            Home Medications    Prior to Admission medications   Medication Sig Start Date End Date Taking? Authorizing Provider  levothyroxine (SYNTHROID) 50 MCG tablet Take 1 tablet by mouth once daily 03/16/22  Yes     Family History Family History  Problem Relation Age of Onset   Hypertension Mother    Hyperlipidemia Mother    COPD Mother    Hypothyroidism Mother     Social History Social History    Tobacco Use   Smoking status: Never   Smokeless tobacco: Never  Vaping Use   Vaping Use: Never used  Substance Use Topics   Alcohol use: Yes    Comment: occ   Drug use: No     Allergies   Patient has no known allergies.   Review of Systems Review of Systems See HPI  Physical Exam Triage Vital Signs ED Triage Vitals  Enc Vitals Group     BP 12/30/22 0836 118/81     Pulse Rate 12/30/22 0836 85     Resp 12/30/22 0836 18     Temp 12/30/22 0836 99.5 F (37.5 C)     Temp Source 12/30/22 0836 Oral     SpO2 12/30/22 0836 99 %     Weight 12/30/22 0838 160 lb (72.6 kg)     Height 12/30/22 0838 5\' 1"  (1.549 m)     Head Circumference --      Peak Flow --      Pain Score 12/30/22 0837 8     Pain Loc --      Pain Edu? --      Excl. in  GC? --    No data found.  Updated Vital Signs BP 118/81 (BP Location: Right Arm)   Pulse 85   Temp 99.5 F (37.5 C) (Oral)   Resp 18   Ht 5\' 1"  (1.549 m)   Wt 72.6 kg   SpO2 99%   Breastfeeding No   BMI 30.23 kg/m       Physical Exam Constitutional:      General: She is not in acute distress.    Appearance: She is well-developed.  HENT:     Head: Normocephalic and atraumatic.  Eyes:     Conjunctiva/sclera: Conjunctivae normal.     Pupils: Pupils are equal, round, and reactive to light.  Cardiovascular:     Rate and Rhythm: Normal rate.  Pulmonary:     Effort: Pulmonary effort is normal. No respiratory distress.  Abdominal:     General: There is no distension.     Palpations: Abdomen is soft.  Musculoskeletal:        General: Swelling, tenderness and signs of injury present. Normal range of motion.     Cervical back: Normal range of motion.     Comments: No specific palpable tenderness although patient can flex her elbow only to 90 degrees and her extension lacks the final 10 degrees.  No pain elicited with pronation and supination.  Elbow is swollen  Skin:    General: Skin is warm and dry.  Neurological:     Mental  Status: She is alert.      UC Treatments / Results  Labs (all labs ordered are listed, but only abnormal results are displayed) Labs Reviewed - No data to display  EKG   Radiology DG Elbow Complete Left  Result Date: 12/30/2022 CLINICAL DATA:  Follow are roller-skating.  Elbow injury.  Painful. EXAM: LEFT ELBOW - COMPLETE 3+ VIEW COMPARISON:  None Available. FINDINGS: Four views study shows no evidence for an acute fracture. There is no dislocation. Lateral film demonstrates elevation of the anterior posterior fat pads consistent with the presence of a joint effusion. IMPRESSION: 1. Joint effusion at the elbow. There is no visible fracture, but the presence of a joint effusion raises the question of an occult fracture. Correlate clinically and consider follow-up MRI to further evaluate as warranted. Electronically Signed   By: Misty Stanley M.D.   On: 12/30/2022 09:13    Procedures Procedures (including critical care time)  Medications Ordered in UC Medications - No data to display  Initial Impression / Assessment and Plan / UC Course  I have reviewed the triage vital signs and the nursing notes.  Pertinent labs & imaging results that were available during my care of the patient were reviewed by me and considered in my medical decision making (see chart for details).    X-ray results are discussed with patient Final Clinical Impressions(s) / UC Diagnoses   Final diagnoses:  Elbow injury, left, initial encounter  Joint effusion of elbow, left     Discharge Instructions      Take ibuprofen or naproxen for pain Limit use of arm.  Wear sling for protection Use ice for 20 minutes every couple of hours Remove sling for gentle range of motion only See Dr. Raeford Razor next week and follow-up     ED Prescriptions   None    PDMP not reviewed this encounter.   Raylene Everts, MD 12/30/22 [phone]

## 2022-12-30 NOTE — Discharge Instructions (Signed)
Take ibuprofen or naproxen for pain Limit use of arm.  Wear sling for protection Use ice for 20 minutes every couple of hours Remove sling for gentle range of motion only See Dr. Raeford Razor next week and follow-up

## 2023-01-03 ENCOUNTER — Encounter: Payer: Self-pay | Admitting: Family Medicine

## 2023-01-03 ENCOUNTER — Ambulatory Visit (INDEPENDENT_AMBULATORY_CARE_PROVIDER_SITE_OTHER): Payer: Commercial Managed Care - PPO | Admitting: Family Medicine

## 2023-01-03 VITALS — BP 122/70 | Ht 61.0 in | Wt 160.0 lb

## 2023-01-03 DIAGNOSIS — S52125A Nondisplaced fracture of head of left radius, initial encounter for closed fracture: Secondary | ICD-10-CM | POA: Diagnosis not present

## 2023-01-03 NOTE — Progress Notes (Signed)
  Hannah Floyd - 37 y.o. female MRN [id number]  Date of birth: 08-15-1986  SUBJECTIVE:  Including CC & ROS.  No chief complaint on file.   Hannah Floyd is a 37 y.o. female that is  presenting with left elbow pain. She fell at a skating out onto her outstretched hand. Having limited range of motion and pain at the point.   Independent review of the left elbow xray from 1/07 shows a joint effusion.   Review of Systems See HPI   HISTORY: Past Medical, Surgical, Social, and Family History Reviewed & Updated per EMR.   Pertinent Historical Findings include:  Past Medical History:  Diagnosis Date   Folliculitis    Hypothyroidism    Postoperative anemia due to acute blood loss 01/02/2018   Vaginal Pap smear, abnormal     Past Surgical History:  Procedure Laterality Date   APPENDECTOMY     CESAREAN SECTION     CESAREAN SECTION N/A 01/01/2018   Procedure: Repeat CESAREAN SECTION;  Surgeon: Brien Few, MD;  Location: [facility];  Service: Obstetrics;  Laterality: N/A;  EDD: 01/27/18   LAPAROSCOPIC APPENDECTOMY N/A 01/30/2016   Procedure: APPENDECTOMY LAPAROSCOPIC;  Surgeon: Jackolyn Confer, MD;  Location: [facility];  Service: General;  Laterality: N/A;   LEEP       PHYSICAL EXAM:  VS: BP 122/70   Ht 5\' 1"  (1.549 m)   Wt 160 lb (72.6 kg)   BMI 30.23 kg/m  Physical Exam Gen: NAD, alert, cooperative with exam, well-appearing MSK:  Neurovascularly intact       ASSESSMENT & PLAN:   Closed nondisplaced fracture of head of left radius Acutely occurring. Likely has an occult fracture with effusion on xray and limited range of motion on exam.  - counseled on home exercise therapy and supportive care - counseled on compression  - f/u in 2 weeks.  - could consider PT.

## 2023-01-03 NOTE — Patient Instructions (Signed)
Nice to meet you Please try the ACE wrap  Please try ice as needed  Please use tylenol if needed   Please send me a message in MyChart with any questions or updates.  Please see me back in 2 weeks.   --Dr. Raeford Razor

## 2023-01-03 NOTE — Assessment & Plan Note (Signed)
Acutely occurring. Likely has an occult fracture with effusion on xray and limited range of motion on exam.  - counseled on home exercise therapy and supportive care - counseled on compression  - f/u in 2 weeks.  - could consider PT.

## 2023-04-08 ENCOUNTER — Encounter: Payer: Self-pay | Admitting: *Deleted

## 2023-06-18 ENCOUNTER — Other Ambulatory Visit (HOSPITAL_BASED_OUTPATIENT_CLINIC_OR_DEPARTMENT_OTHER): Payer: Self-pay

## 2023-08-08 DIAGNOSIS — Z124 Encounter for screening for malignant neoplasm of cervix: Secondary | ICD-10-CM | POA: Diagnosis not present

## 2023-08-08 DIAGNOSIS — R7989 Other specified abnormal findings of blood chemistry: Secondary | ICD-10-CM | POA: Diagnosis not present

## 2023-08-08 DIAGNOSIS — Z113 Encounter for screening for infections with a predominantly sexual mode of transmission: Secondary | ICD-10-CM | POA: Diagnosis not present

## 2023-08-08 DIAGNOSIS — Z01411 Encounter for gynecological examination (general) (routine) with abnormal findings: Secondary | ICD-10-CM | POA: Diagnosis not present

## 2023-08-08 DIAGNOSIS — Z01419 Encounter for gynecological examination (general) (routine) without abnormal findings: Secondary | ICD-10-CM | POA: Diagnosis not present

## 2023-08-08 DIAGNOSIS — Z0000 Encounter for general adult medical examination without abnormal findings: Secondary | ICD-10-CM | POA: Diagnosis not present

## 2024-04-06 ENCOUNTER — Other Ambulatory Visit (HOSPITAL_BASED_OUTPATIENT_CLINIC_OR_DEPARTMENT_OTHER): Payer: Self-pay

## 2024-04-06 DIAGNOSIS — L293 Anogenital pruritus, unspecified: Secondary | ICD-10-CM | POA: Diagnosis not present

## 2024-04-06 MED ORDER — FLUCONAZOLE 150 MG PO TABS
150.0000 mg | ORAL_TABLET | ORAL | 0 refills | Status: DC
  Filled 2024-04-06: qty 3, 7d supply, fill #0

## 2024-06-24 ENCOUNTER — Other Ambulatory Visit (HOSPITAL_COMMUNITY): Payer: Self-pay

## 2024-06-24 ENCOUNTER — Telehealth: Admitting: Physician Assistant

## 2024-06-24 DIAGNOSIS — B3731 Acute candidiasis of vulva and vagina: Secondary | ICD-10-CM

## 2024-06-24 MED ORDER — FLUCONAZOLE 150 MG PO TABS
150.0000 mg | ORAL_TABLET | ORAL | 0 refills | Status: DC
  Filled 2024-06-24: qty 3, 7d supply, fill #0

## 2024-06-24 NOTE — Progress Notes (Signed)

## 2024-06-24 NOTE — Progress Notes (Signed)
 I have spent 5 minutes in review of e-visit questionnaire, review and updating patient chart, medical decision making and response to patient.   Piedad Climes, PA-C

## 2024-07-20 ENCOUNTER — Ambulatory Visit: Admitting: Family Medicine

## 2024-07-20 ENCOUNTER — Encounter: Payer: Self-pay | Admitting: Family Medicine

## 2024-07-20 VITALS — BP 102/65 | HR 84 | Temp 97.4°F | Ht 60.0 in | Wt 147.0 lb

## 2024-07-20 DIAGNOSIS — Z136 Encounter for screening for cardiovascular disorders: Secondary | ICD-10-CM

## 2024-07-20 DIAGNOSIS — Z7689 Persons encountering health services in other specified circumstances: Secondary | ICD-10-CM

## 2024-07-20 DIAGNOSIS — Z0000 Encounter for general adult medical examination without abnormal findings: Secondary | ICD-10-CM

## 2024-07-20 DIAGNOSIS — Z13220 Encounter for screening for lipoid disorders: Secondary | ICD-10-CM

## 2024-07-20 DIAGNOSIS — Z1159 Encounter for screening for other viral diseases: Secondary | ICD-10-CM | POA: Insufficient documentation

## 2024-07-20 DIAGNOSIS — Z111 Encounter for screening for respiratory tuberculosis: Secondary | ICD-10-CM | POA: Diagnosis not present

## 2024-07-20 DIAGNOSIS — Z23 Encounter for immunization: Secondary | ICD-10-CM

## 2024-07-20 DIAGNOSIS — Z1231 Encounter for screening mammogram for malignant neoplasm of breast: Secondary | ICD-10-CM | POA: Insufficient documentation

## 2024-07-20 DIAGNOSIS — Z13228 Encounter for screening for other metabolic disorders: Secondary | ICD-10-CM | POA: Insufficient documentation

## 2024-07-20 NOTE — Progress Notes (Signed)
 Complete physical exam  Patient: Hannah Floyd   DOB: 1986/04/15   38 y.o. Female  MRN: [id number]  Subjective:    Chief Complaint  Patient presents with   Establish Care    Needs TB Gold and Tdap, may want Physical    Hannah Floyd is a 38 y.o. female who presents today for a complete physical exam. She reports consuming a general diet. walking She generally feels well. She reports sleeping poorly. She does not have additional problems to discuss today.    Most recent fall risk assessment:    03/14/2018    8:21 AM  Fall Risk   Falls in the past year? No      Data saved with a previous flowsheet row definition     Most recent depression screenings:    03/14/2018    8:21 AM 04/23/2017   10:07 AM  PHQ 2/9 Scores  PHQ - 2 Score 0 0    Vision:Within last year and Dental: No current dental problems and Receives regular dental care    Patient Care Team: Patient, No Pcp Per as PCP - General (General Practice)   Outpatient Medications Prior to Visit  Medication Sig   fluconazole  (DIFLUCAN ) 150 MG tablet Take 1 tablet (150 mg total) by mouth as directed on day 1, day 4 and day 7.   No facility-administered medications prior to visit.    ROS        Objective:     BP 102/65 (BP Location: Left Arm, Patient Position: Sitting, Cuff Size: Normal)   Pulse 84   Temp (!) 97.4 F (36.3 C) (Oral)   Ht 5' (1.524 m)   Wt 147 lb (66.7 kg)   LMP  (LMP Unknown)   SpO2 100%   BMI 28.71 kg/m    Physical Exam Vitals and nursing note reviewed.  Constitutional:      General: She is not in acute distress.    Appearance: Normal appearance.  HENT:     Right Ear: Tympanic membrane normal.     Left Ear: Tympanic membrane normal.     Nose: Nose normal.     Mouth/Throat:     Mouth: Mucous membranes are moist.     Pharynx: Oropharynx is clear.  Eyes:     Extraocular Movements: Extraocular movements intact.  Neck:     Thyroid : No thyroid  tenderness.   Cardiovascular:     Rate and Rhythm: Normal rate and regular rhythm.     Pulses:          Radial pulses are 2+ on the right side and 2+ on the left side.     Heart sounds: Normal heart sounds, S1 normal and S2 normal.  Pulmonary:     Effort: Pulmonary effort is normal.     Breath sounds: Normal breath sounds.  Abdominal:     General: Bowel sounds are normal.     Palpations: Abdomen is soft.     Tenderness: There is no abdominal tenderness.  Musculoskeletal:        General: Normal range of motion.     Cervical back: Normal range of motion.     Right lower leg: No edema.     Left lower leg: No edema.  Lymphadenopathy:     Cervical:     Right cervical: No superficial cervical adenopathy.    Left cervical: No superficial cervical adenopathy.  Skin:    General: Skin is warm and dry.  Neurological:  General: No focal deficit present.     Mental Status: She is alert. Mental status is at baseline.  Psychiatric:        Mood and Affect: Mood normal.        Behavior: Behavior normal.        Thought Content: Thought content normal.        Judgment: Judgment normal.      No results found for any visits on 07/20/24.     Assessment & Plan:    Routine Health Maintenance and Physical Exam  Immunization History  Administered Date(s) Administered   PPD Test 12/17/2011, 12/30/2011, 12/21/2012, 08/02/2013   Tdap 07/20/2024    Health Maintenance  Topic Date Due   Hepatitis C Screening  Never done   Hepatitis B Vaccines (1 of 3 - 19+ 3-dose series) Never done   HPV VACCINES (1 - 3-dose SCDM series) Never done   Cervical Cancer Screening (HPV/Pap Cotest)  12/12/2019   COVID-19 Vaccine (1 - 2024-25 season) Never done   INFLUENZA VACCINE  07/24/2024   DTaP/Tdap/Td (2 - Td or Tdap) 07/20/2034   HIV Screening  Completed   Meningococcal B Vaccine  Aged Out    Discussed health benefits of physical activity, and encouraged her to engage in regular exercise appropriate for her age  and condition.  Annual physical exam  Establishing care with new doctor, encounter for -     CBC -     Comprehensive metabolic panel with GFR  Encounter for administration of vaccine -     Tdap vaccine greater than or equal to 7yo IM  Screening examination for pulmonary tuberculosis -     QuantiFERON-TB Gold Plus  Encounter for lipid screening for cardiovascular disease -     Lipid panel  Encounter for screening for metabolic disorder -     Comprehensive metabolic panel with GFR -     Hemoglobin A1c -     TSH+T4F+T3Free  Screening for viral disease -     Hepatitis C antibody      Routine labs ordered.  HCM reviewed/discussed. Will return for pap only visit. T dap today for school.  Anticipatory guidance regarding healthy weight, lifestyle and choices given. Recommend healthy diet.  Recommend approximately 150 minutes/week of moderate intensity exercise. Resistance training is good for building muscles and for bone health. Muscle mass helps to increase our metabolism and to burn more calories at rest.  Limit alcohol consumption: no more than one drink per day for women and 2 drinks per day for me. Recommend regular dental and vision exams. Always use seatbelt/lap and shoulder restraints. Recommend using smoke alarms and checking batteries at least twice a year. Recommend using sunscreen when outside.  Please know that I am here to help you with all of your health care goals and am happy to work with you to find a solution that works best for you.  The greatest advice I have received with any changes in life are to take it one step at a time, that even means if all you can focus on is the next 60 seconds, then do that and celebrate your victories.  With any changes in life, you will have set backs, and that is OK. The important thing to remember is, if you have a set back, it is not a failure, it is an opportunity to try again! Agrees with plan of care discussed.  Questions  answered.      Return in about 4 weeks (around  08/17/2024) for pap only .     Darice JONELLE Brownie, FNP

## 2024-07-23 ENCOUNTER — Ambulatory Visit: Payer: Self-pay | Admitting: Family Medicine

## 2024-07-23 LAB — TSH+T4F+T3FREE
Free T4: 0.94 ng/dL (ref 0.82–1.77)
T3, Free: 3.5 pg/mL (ref 2.0–4.4)
TSH: 3.25 u[IU]/mL (ref 0.450–4.500)

## 2024-07-23 LAB — COMPREHENSIVE METABOLIC PANEL WITH GFR
ALT: 12 IU/L (ref 0–32)
AST: 16 IU/L (ref 0–40)
Albumin: 4.3 g/dL (ref 3.9–4.9)
Alkaline Phosphatase: 75 IU/L (ref 44–121)
BUN/Creatinine Ratio: 15 (ref 9–23)
BUN: 13 mg/dL (ref 6–24)
Bilirubin Total: 0.8 mg/dL (ref 0.0–1.2)
CO2: 21 mmol/L (ref 20–29)
Calcium: 9.7 mg/dL (ref 8.7–10.2)
Chloride: 103 mmol/L (ref 96–106)
Creatinine, Ser: 0.84 mg/dL (ref 0.57–1.00)
Globulin, Total: 2.8 g/dL (ref 1.5–4.5)
Glucose: 83 mg/dL (ref 70–99)
Potassium: 4.3 mmol/L (ref 3.5–5.2)
Sodium: 139 mmol/L (ref 134–144)
Total Protein: 7.1 g/dL (ref 6.0–8.5)
eGFR: 90 mL/min/1.73 (ref >59)

## 2024-07-23 LAB — HEPATITIS C ANTIBODY: Hep C Virus Ab: NONREACTIVE

## 2024-07-23 LAB — LIPID PANEL
Chol/HDL Ratio: 2.3 ratio (ref 0.0–4.4)
Cholesterol, Total: 125 mg/dL (ref 100–199)
HDL: 55 mg/dL (ref >39)
LDL Chol Calc (NIH): 59 mg/dL (ref 0–99)
Triglycerides: 43 mg/dL (ref 0–149)
VLDL Cholesterol Cal: 11 mg/dL (ref 5–40)

## 2024-07-23 LAB — HEMOGLOBIN A1C
Est. average glucose Bld gHb Est-mCnc: 108 mg/dL
Hgb A1c MFr Bld: 5.4 % (ref 4.8–5.6)

## 2024-07-23 LAB — CBC
Hematocrit: 44 % (ref 34.0–46.6)
Hemoglobin: 14.5 g/dL (ref 11.1–15.9)
MCH: 30 pg (ref 26.6–33.0)
MCHC: 33 g/dL (ref 31.5–35.7)
MCV: 91 fL (ref 79–97)
Platelets: 246 x10E3/uL (ref 150–450)
RBC: 4.84 x10E6/uL (ref 3.77–5.28)
RDW: 12.7 % (ref 11.7–15.4)
WBC: 5.5 x10E3/uL (ref 3.4–10.8)

## 2024-07-23 LAB — QUANTIFERON-TB GOLD PLUS
QuantiFERON Mitogen Value: >10 [IU]/mL
QuantiFERON Nil Value: 0.02 [IU]/mL
QuantiFERON TB1 Ag Value: 0.04 [IU]/mL
QuantiFERON TB2 Ag Value: 0.03 [IU]/mL
QuantiFERON-TB Gold Plus: NEGATIVE

## 2024-08-17 ENCOUNTER — Ambulatory Visit: Admitting: Family Medicine

## 2024-08-17 ENCOUNTER — Encounter: Payer: Self-pay | Admitting: Family Medicine

## 2024-08-17 VITALS — BP 101/66 | HR 69 | Temp 98.1°F | Ht 60.0 in | Wt 149.0 lb

## 2024-08-17 DIAGNOSIS — Z124 Encounter for screening for malignant neoplasm of cervix: Secondary | ICD-10-CM | POA: Insufficient documentation

## 2024-08-17 DIAGNOSIS — Z1231 Encounter for screening mammogram for malignant neoplasm of breast: Secondary | ICD-10-CM

## 2024-08-17 NOTE — Progress Notes (Signed)
   Established Patient Office Visit  Subjective   Patient ID: Hannah Floyd, female    DOB: Sep 08, 1986  Age: 38 y.o. MRN: [id number]  Chief Complaint  Patient presents with   Gynecologic Exam    Presents today for pap only vist.  CPE done on 7/28.  [facility]  Reports some pain with intercourse.       ROS    Objective:     BP 101/66 (BP Location: Left Arm, Patient Position: Sitting, Cuff Size: Normal)   Pulse 69   Temp 98.1 F (36.7 C) (Oral)   Ht 5' (1.524 m)   Wt 149 lb (67.6 kg)   LMP 08/03/2024 (Approximate)   SpO2 99%   BMI 29.10 kg/m    Physical Exam Vitals and nursing note reviewed. Exam conducted with a chaperone present.  Constitutional:      General: She is not in acute distress.    Appearance: Normal appearance.  Cardiovascular:     Rate and Rhythm: Normal rate.  Pulmonary:     Effort: Pulmonary effort is normal.  Chest:  Breasts:    Right: Normal.     Left: Normal.  Genitourinary:    General: Normal vulva.     Exam position: Lithotomy position.     Vagina: Normal.     Cervix: Normal.     Adnexa: Right adnexa normal and left adnexa normal.     Comments: IUD sting in place.  Lymphadenopathy:     Upper Body:     Right upper body: No axillary adenopathy.     Left upper body: No axillary adenopathy.  Skin:    General: Skin is warm and dry.  Neurological:     General: No focal deficit present.     Mental Status: She is alert. Mental status is at baseline.  Psychiatric:        Mood and Affect: Mood normal.        Behavior: Behavior normal.        Thought Content: Thought content normal.        Judgment: Judgment normal.      No results found for any visits on 08/17/24.    The ASCVD Risk score (Arnett DK, et al., 2019) failed to calculate for the following reasons:   The valid total cholesterol range is 130 to 320 mg/dL    Assessment & Plan:   Problem List Items Addressed This Visit      Encounter for screening mammogram for malignant neoplasm of breast   Relevant Orders   MM DIGITAL SCREENING BILATERAL   Screening for cervical cancer - Primary   Tolerated pap smear well. Reports some pain with intercourse. Discussed getting transvaginal ultrasound. She will notify provider if this is something she wants to pursue in the future.       Relevant Orders   IGP, Aptima HPV  Agrees with plan of care discussed.  Questions answered.   Return in about 11 months (around 07/21/2025) for CPE with labs.    Darice JONELLE Brownie, FNP

## 2024-08-17 NOTE — Assessment & Plan Note (Signed)
 Tolerated pap smear well. Reports some pain with intercourse. Discussed getting transvaginal ultrasound. She will notify provider if this is something she wants to pursue in the future.

## 2024-08-19 ENCOUNTER — Ambulatory Visit: Payer: Self-pay | Admitting: Family Medicine

## 2024-08-19 LAB — IGP, APTIMA HPV
HPV Aptima: NEGATIVE
PAP Smear Comment: 0

## 2024-09-10 ENCOUNTER — Ambulatory Visit

## 2024-09-11 ENCOUNTER — Ambulatory Visit

## 2024-09-25 ENCOUNTER — Ambulatory Visit
Admission: RE | Admit: 2024-09-25 | Discharge: 2024-09-25 | Disposition: A | Discharge status: Home / Self Care | Source: Ambulatory Visit | Attending: Family Medicine | Admitting: Family Medicine

## 2024-09-25 DIAGNOSIS — Z1231 Encounter for screening mammogram for malignant neoplasm of breast: Secondary | ICD-10-CM | POA: Diagnosis not present

## 2024-10-02 NOTE — Progress Notes (Signed)
 Please call patient. Normal mammogram.  Repeat in 1 year.

## 2024-10-24 ENCOUNTER — Telehealth: Admitting: Emergency Medicine

## 2024-10-24 DIAGNOSIS — L259 Unspecified contact dermatitis, unspecified cause: Secondary | ICD-10-CM | POA: Diagnosis not present

## 2024-10-24 MED ORDER — TRIAMCINOLONE ACETONIDE 0.1 % EX CREA: 1.0000 | TOPICAL_CREAM | Freq: Two times a day (BID) | CUTANEOUS | 0 refills | Status: AC

## 2024-10-24 MED ORDER — PREDNISONE 20 MG PO TABS: 40.0000 mg | ORAL_TABLET | Freq: Every day | ORAL | 0 refills | Status: AC

## 2024-10-24 MED ORDER — TRIAMCINOLONE ACETONIDE 0.1 % EX CREA: 1.0000 | TOPICAL_CREAM | Freq: Two times a day (BID) | CUTANEOUS | 0 refills | Status: DC

## 2024-10-24 NOTE — Addendum Note (Signed)
 Addended by: Lindalee Huizinga M on: 10/24/2024 04:05 PM   Modules accepted: Orders

## 2024-10-24 NOTE — Progress Notes (Signed)
 E Visit for Rash  We are sorry that you are not feeling well. Here is how we plan to help!  Based on what you shared with me it looks like you have contact dermatitis.  Contact dermatitis is a skin rash caused by something that touches the skin and causes irritation or inflammation.  Your skin may be red, swollen, dry, cracked, and itch.  The rash should go away in a few days but can last a few weeks.     I am prescribing prednisone and I am prescribing triamcinolone 0.1 % cream -- apply to the affected area(s) in a thin layer, twice daily for up to 14 days. Do not apply to face, privates or armpit regions.    HOME CARE:  Take cool showers and avoid direct sunlight. Apply cool compress or wet dressings. Take a bath in an oatmeal bath.  Sprinkle content of one Aveeno packet under running faucet with comfortably warm water.  Bathe for 15-20 minutes, 1-2 times daily.  Pat dry with a towel. Do not rub the rash. Use hydrocortisone cream. Take an antihistamine like Benadryl  for widespread rashes that itch.  The adult dose of Benadryl  is 25-50 mg by mouth 4 times daily. Caution:  This type of medication may cause sleepiness.  Do not drink alcohol, drive, or operate dangerous machinery while taking antihistamines.  Do not take these medications if you have prostate enlargement.  Read package instructions thoroughly on all medications that you take.  GET HELP RIGHT AWAY IF:  Symptoms don't go away after treatment. Severe itching that persists. If you rash spreads or swells. If you rash begins to smell. If it blisters and opens or develops a yellow-brown crust. You develop a fever. You have a sore throat. You become short of breath.  MAKE SURE YOU:  Understand these instructions. Will watch your condition. Will get help right away if you are not doing well or get worse.  Thank you for choosing an e-visit. Your e-visit answers were reviewed by a board certified advanced clinical practitioner  to complete your personal care plan. Depending upon the condition, your plan could have included both over the counter or prescription medications. Please review your pharmacy choice. Be sure that the pharmacy you have chosen is open so that you can pick up your prescription now.  If there is a problem you may message your provider in MyChart to have the prescription routed to another pharmacy. Your safety is important to us . If you have drug allergies check your prescription carefully.  For the next 24 hours, you can use MyChart to ask questions about today's visit, request a non-urgent call back, or ask for a work or school excuse from your e-visit provider. You will get an email in the next two days asking about your experience. I hope that your e-visit has been valuable and will speed your recovery.  I have spent 5 minutes in review of e-visit questionnaire, review and updating patient chart, medical decision making and response to patient.   Jon Belt, PhD, FNP-BC

## 2024-11-09 DIAGNOSIS — F4323 Adjustment disorder with mixed anxiety and depressed mood: Secondary | ICD-10-CM | POA: Diagnosis not present

## 2024-11-23 DIAGNOSIS — F4323 Adjustment disorder with mixed anxiety and depressed mood: Secondary | ICD-10-CM | POA: Diagnosis not present

## 2024-12-17 ENCOUNTER — Telehealth

## 2024-12-17 DIAGNOSIS — B3731 Acute candidiasis of vulva and vagina: Secondary | ICD-10-CM

## 2024-12-18 ENCOUNTER — Other Ambulatory Visit (HOSPITAL_COMMUNITY): Payer: Self-pay

## 2024-12-18 MED ORDER — FLUCONAZOLE 150 MG PO TABS
150.0000 mg | ORAL_TABLET | ORAL | 0 refills | Status: AC
  Filled 2024-12-18: qty 3, 7d supply, fill #0

## 2024-12-18 NOTE — Progress Notes (Signed)
